# Patient Record
Sex: Female | Born: 1975 | Race: White | Hispanic: No | Marital: Single | Smoking: Current some day smoker
Health system: Southern US, Community
[De-identification: ages and names within clinical notes are randomized; demographics above are authoritative.]

## PROBLEM LIST (undated history)

## (undated) DIAGNOSIS — F419 Anxiety disorder, unspecified: Secondary | ICD-10-CM

## (undated) DIAGNOSIS — G43909 Migraine, unspecified, not intractable, without status migrainosus: Secondary | ICD-10-CM

## (undated) DIAGNOSIS — N83209 Unspecified ovarian cyst, unspecified side: Secondary | ICD-10-CM

## (undated) DIAGNOSIS — O139 Gestational [pregnancy-induced] hypertension without significant proteinuria, unspecified trimester: Secondary | ICD-10-CM

## (undated) HISTORY — PX: LEFT OOPHORECTOMY: SHX1961

## (undated) HISTORY — PX: LAPAROSCOPY: SHX197

## (undated) HISTORY — PX: TUBAL LIGATION: SHX77

## (undated) HISTORY — PX: ABDOMINAL HYSTERECTOMY: SHX81

---

## 1998-08-24 ENCOUNTER — Inpatient Hospital Stay (HOSPITAL_COMMUNITY): Admission: AD | Admit: 1998-08-24 | Discharge: 1998-08-24 | Payer: Self-pay | Admitting: Obstetrics & Gynecology

## 1998-09-23 ENCOUNTER — Inpatient Hospital Stay (HOSPITAL_COMMUNITY): Admission: AD | Admit: 1998-09-23 | Discharge: 1998-09-23 | Payer: Self-pay | Admitting: Obstetrics and Gynecology

## 1998-10-09 ENCOUNTER — Inpatient Hospital Stay (HOSPITAL_COMMUNITY): Admission: AD | Admit: 1998-10-09 | Discharge: 1998-10-09 | Payer: Self-pay | Admitting: Obstetrics and Gynecology

## 1998-10-29 ENCOUNTER — Inpatient Hospital Stay (HOSPITAL_COMMUNITY): Admission: AD | Admit: 1998-10-29 | Discharge: 1998-10-31 | Payer: Self-pay | Admitting: *Deleted

## 1999-01-30 ENCOUNTER — Ambulatory Visit (HOSPITAL_COMMUNITY): Admission: RE | Admit: 1999-01-30 | Discharge: 1999-01-30 | Payer: Self-pay | Admitting: *Deleted

## 2000-11-15 ENCOUNTER — Emergency Department (HOSPITAL_COMMUNITY): Admission: EM | Admit: 2000-11-15 | Discharge: 2000-11-16 | Payer: Self-pay | Admitting: Emergency Medicine

## 2001-07-23 ENCOUNTER — Ambulatory Visit (HOSPITAL_COMMUNITY): Admission: RE | Admit: 2001-07-23 | Discharge: 2001-07-23 | Payer: Self-pay | Admitting: *Deleted

## 2001-08-13 ENCOUNTER — Ambulatory Visit (HOSPITAL_COMMUNITY): Admission: RE | Admit: 2001-08-13 | Discharge: 2001-08-13 | Payer: Self-pay | Admitting: *Deleted

## 2001-08-13 ENCOUNTER — Encounter (INDEPENDENT_AMBULATORY_CARE_PROVIDER_SITE_OTHER): Payer: Self-pay

## 2002-03-07 ENCOUNTER — Other Ambulatory Visit: Admission: RE | Admit: 2002-03-07 | Discharge: 2002-03-07 | Payer: Self-pay | Admitting: Obstetrics and Gynecology

## 2003-04-03 ENCOUNTER — Other Ambulatory Visit: Admission: RE | Admit: 2003-04-03 | Discharge: 2003-04-03 | Payer: Self-pay | Admitting: *Deleted

## 2003-04-18 ENCOUNTER — Ambulatory Visit (HOSPITAL_COMMUNITY): Admission: RE | Admit: 2003-04-18 | Discharge: 2003-04-18 | Payer: Self-pay | Admitting: *Deleted

## 2004-04-29 ENCOUNTER — Other Ambulatory Visit: Admission: RE | Admit: 2004-04-29 | Discharge: 2004-04-29 | Payer: Self-pay | Admitting: Obstetrics and Gynecology

## 2004-09-09 ENCOUNTER — Emergency Department: Payer: Self-pay | Admitting: Emergency Medicine

## 2004-09-09 ENCOUNTER — Emergency Department (HOSPITAL_COMMUNITY): Admission: EM | Admit: 2004-09-09 | Discharge: 2004-09-09 | Payer: Self-pay | Admitting: Emergency Medicine

## 2004-09-10 ENCOUNTER — Ambulatory Visit: Payer: Self-pay | Admitting: Emergency Medicine

## 2004-12-20 ENCOUNTER — Emergency Department: Payer: Self-pay | Admitting: Emergency Medicine

## 2005-01-23 ENCOUNTER — Emergency Department: Payer: Self-pay | Admitting: Internal Medicine

## 2005-06-10 ENCOUNTER — Emergency Department: Payer: Self-pay | Admitting: Emergency Medicine

## 2005-07-08 ENCOUNTER — Other Ambulatory Visit: Admission: RE | Admit: 2005-07-08 | Discharge: 2005-07-08 | Payer: Self-pay | Admitting: Obstetrics and Gynecology

## 2005-08-20 ENCOUNTER — Emergency Department: Payer: Self-pay | Admitting: Emergency Medicine

## 2005-09-13 ENCOUNTER — Inpatient Hospital Stay (HOSPITAL_COMMUNITY): Admission: AD | Admit: 2005-09-13 | Discharge: 2005-09-13 | Payer: Self-pay | Admitting: Obstetrics and Gynecology

## 2005-09-21 ENCOUNTER — Inpatient Hospital Stay (HOSPITAL_COMMUNITY): Admission: AD | Admit: 2005-09-21 | Discharge: 2005-09-21 | Payer: Self-pay | Admitting: Obstetrics and Gynecology

## 2005-11-24 ENCOUNTER — Emergency Department: Payer: Self-pay | Admitting: Emergency Medicine

## 2006-04-12 ENCOUNTER — Inpatient Hospital Stay (HOSPITAL_COMMUNITY): Admission: AD | Admit: 2006-04-12 | Discharge: 2006-04-12 | Payer: Self-pay | Admitting: Obstetrics and Gynecology

## 2006-04-12 ENCOUNTER — Emergency Department: Payer: Self-pay | Admitting: Emergency Medicine

## 2006-05-12 ENCOUNTER — Emergency Department: Payer: Self-pay | Admitting: Internal Medicine

## 2006-10-17 ENCOUNTER — Emergency Department: Payer: Self-pay | Admitting: Emergency Medicine

## 2007-07-17 ENCOUNTER — Inpatient Hospital Stay (HOSPITAL_COMMUNITY): Admission: AD | Admit: 2007-07-17 | Discharge: 2007-07-18 | Payer: Self-pay | Admitting: Obstetrics and Gynecology

## 2007-12-26 ENCOUNTER — Inpatient Hospital Stay (HOSPITAL_COMMUNITY): Admission: AD | Admit: 2007-12-26 | Discharge: 2007-12-27 | Payer: Self-pay | Admitting: Obstetrics and Gynecology

## 2007-12-26 ENCOUNTER — Emergency Department: Payer: Self-pay | Admitting: Unknown Physician Specialty

## 2008-01-07 ENCOUNTER — Emergency Department: Payer: Self-pay | Admitting: Emergency Medicine

## 2008-04-20 ENCOUNTER — Emergency Department: Payer: Self-pay | Admitting: Emergency Medicine

## 2008-08-23 ENCOUNTER — Emergency Department (HOSPITAL_COMMUNITY): Admission: EM | Admit: 2008-08-23 | Discharge: 2008-08-23 | Payer: Self-pay | Admitting: Emergency Medicine

## 2008-08-31 ENCOUNTER — Emergency Department: Payer: Self-pay | Admitting: Unknown Physician Specialty

## 2008-10-06 ENCOUNTER — Emergency Department: Payer: Self-pay | Admitting: Internal Medicine

## 2008-11-15 ENCOUNTER — Emergency Department: Payer: Self-pay | Admitting: Emergency Medicine

## 2008-11-16 ENCOUNTER — Emergency Department: Payer: Self-pay | Admitting: Internal Medicine

## 2009-08-31 ENCOUNTER — Inpatient Hospital Stay (HOSPITAL_COMMUNITY): Admission: AD | Admit: 2009-08-31 | Discharge: 2009-08-31 | Payer: Self-pay | Admitting: Obstetrics and Gynecology

## 2009-09-01 ENCOUNTER — Inpatient Hospital Stay (HOSPITAL_COMMUNITY): Admission: AD | Admit: 2009-09-01 | Discharge: 2009-09-02 | Payer: Self-pay | Admitting: Obstetrics and Gynecology

## 2009-11-14 ENCOUNTER — Emergency Department: Payer: Self-pay | Admitting: Unknown Physician Specialty

## 2010-04-19 ENCOUNTER — Emergency Department: Payer: Self-pay | Admitting: Emergency Medicine

## 2010-07-20 ENCOUNTER — Emergency Department: Payer: Self-pay | Admitting: Emergency Medicine

## 2010-09-02 ENCOUNTER — Emergency Department: Payer: Self-pay | Admitting: Emergency Medicine

## 2011-02-20 LAB — URINALYSIS, ROUTINE W REFLEX MICROSCOPIC
Glucose, UA: NEGATIVE mg/dL
Ketones, ur: NEGATIVE mg/dL
Leukocytes, UA: NEGATIVE
Nitrite: NEGATIVE
Specific Gravity, Urine: 1.01 (ref 1.005–1.030)
pH: 6 (ref 5.0–8.0)

## 2011-02-20 LAB — CBC
MCV: 94.5 fL (ref 78.0–100.0)
RBC: 3.95 MIL/uL (ref 3.87–5.11)
WBC: 7.4 10*3/uL (ref 4.0–10.5)

## 2011-02-20 LAB — URINE MICROSCOPIC-ADD ON

## 2011-04-04 NOTE — Op Note (Signed)
NAMEGIULIA, [name]                         ACCOUNT NO.:  [id number]   MEDICAL RECORD NO.:  [id number]                   PATIENT TYPE:  AMB   LOCATION:  SDC                                  FACILITY:  [facility]   PHYSICIAN:  Tracie Harrier, M.D.              DATE OF BIRTH:  1976-01-26   DATE OF PROCEDURE:  04/18/2003  DATE OF DISCHARGE:                                 OPERATIVE REPORT   PREOPERATIVE DIAGNOSES:  1. History of pelvic pain.  2. Endometriosis.   POSTOPERATIVE DIAGNOSES:  1. History of pelvic pain.  2. Endometriosis, mild endometriosis noted.   OPERATION/PROCEDURE:  1. Laparoscopy.  2. Ablation of endometriosis.   ANESTHESIA:  General.   ESTIMATED BLOOD LOSS:  Less than 20 cc.   COMPLICATIONS:  None.   FINDINGS:  Multiple small areas of endometriosis were encountered during  laparoscopy.  The pelvis was consistent with her previous bilateral tubal  ligation.  The uterus was otherwise normal.  The left ovary did have two  areas of endometriosis about 1 cm in size which were ablated.  The right  ovary also with two areas of endometriosis that were ablated.  The posterior  cul-de-sac had two small areas of endometriosis on the left pelvic side wall  and one area of endometriosis on the right side wall.  There was an area of  endometriosis at the uterine cornua on the right.  All of these small areas  were ablated.  There were multiple small cysts on the ovaries bilaterally  and again, these were ablated as well.   DESCRIPTION OF PROCEDURE:  The patient was taken to the operating room where  a general endotracheal anesthetic was administered.  The patient was placed  on the operating room table in the dorsal lithotomy position, the abdomen,  peroneum and vagina were prepped and draped in the usual sterile fashion  with Betadine and sterile drapes.  A Hulka tenaculum was placed in the  intrauterine cavity for uterine manipulation.  The bladder was emptied with  a  red rubber catheter.  A 10 cc of 0.25% were then infiltrated into the area  just below the umbilicus and the right and left lower quadrants on the  patient's old Pfannenstiel incision.  Through the umbilical site, a Veress  needle was inserted under standard fashion for a closed laparoscopy.  This  was done atraumatically and a pneumoperitoneum was created with about 2.5 L  of carbon dioxide gas.  This created a pressure of 15 mmHg in the abdomen.  The Veress needle was removed and a disposable laparoscopic trocar was  inserted atraumatically into the abdominal cavity.  Two accessory incisions  were then made in the right and left lower quadrants, two fingerbreadths  above the pubic symphysis and in the patient's old Pfannenstiel incision.  Through this, two 5 mm trocars were inserted under direct, continuous  laparoscopic guidance.  The  laparoscope was then inserted and a  pneumoperitoneum was maintained with carbon dioxide gas.  The multiple areas  of endometriosis were then visualized, photographed and ablated with the  bipolar Kleppinger cautery.  Good hemostasis was noted.  All areas of  endometriosis were ablated that were encountered.  Good hemostasis was  noted.  The small cyst of the ovary were also ablated using the bipolar  Kleppinger cautery.  The pelvis was thoroughly visualized with no bleeding  noted.  Attention was then turned to closure.   All the abdominal instruments were removed and the gas was allowed to  completely escape from the abdomen.  The incision under the umbilicus was  then closed with three interrupted sutures of 0 Vicryl on the muscle fascia.  The three abdominal incisions were then closed with Dermabond glue.  All  vaginal instruments were removed.  The patient was awakened, extubated and  taken to the recovery room in good condition.  There were no preoperative  complications.                                                 Tracie Harrier,  M.D.    REG/MEDQ  D:  04/18/2003  T:  04/18/2003  Job:  [id number]

## 2011-04-04 NOTE — Op Note (Signed)
[facility]  Patient:    [name], [name] Visit Number: [id number] MRN: [id number]          Service Type: DSU Location: [facility] Attending Physician:  Donne Hazel Dictated by:   Willey Blade, M.D. Proc. Date: 08/13/01 Admit Date:  08/13/2001                             Operative Report  PREOPERATIVE DIAGNOSIS:       Pelvic pain.  POSTOPERATIVE DIAGNOSIS:      Pelvic pain with mild endometriosis.  PROCEDURES:                   1. Laparoscopy.                               2. Removal of Fishee clips.                               3. Ablation of endometriosis by cautery.  SURGEON:                      Willey Blade, M.D.  ANESTHESIA:                   General.  ESTIMATED BLOOD LOSS:         Less than 20 cc.  COMPLICATIONS:                None.  FINDINGS:                     At the time of surgery, the patients previous bilateral tubal ligation was noted.  The Filshee clips were in good location and were removed from the patient.  There were multiple small areas of endometriosis on the right and left ovary. These areas were biopsies for pathologic confirmation.  These were small and fleshy in appearance.  They were very small.  The uterus was otherwise normal.  The appendix was also visualized and noted to be normal.  DESCRIPTION OF PROCEDURE:     The patient was taken to the operating room, where general endotracheal anesthetic was administered.  The patient was placed on the operating table in the dorsal lithotomy position.  The abdomen, perineum and vagina were prepped and draped in the usual sterile fashion with Betadine and sterile drapes.  A Foley catheter was entered and the bladder was emptied.  Next, a Hulka tenaculum was placed in the intrauterine cavity for uterine manipulation.  A small transverse infraumbilical skin incision was made, through which a Veress needle was inserted atraumatically into the abdominal cavity.   Approximately 3.5 L of carbon dioxide gas was then used to create a pneumoperitoneum.  This created approximately 15 mmHg pressure.  The Veress needle was removed and a disposable laparoscopic trocar was inserted atraumatically into the abdominal cavity.  The laparoscope was then inserted with the above-noted findings.  An accessory incision was made two fingerbreadths above the pubic symphysis and in the midline.  Through this, a 5-mm trocar was placed under direct, continuous laparoscopic guidance.  Next, attention was turned to the pathologic findings.  The Filshee clips were removed by cauterizing the surrounding areas of the tube.  These removed very easily.  The left Filshee clip was gently nudged and  came off of the tube. There was a void area and an approximate 2 cm segment of tube missing from both of the tubes.  The Filshee clips were removed and the tubes were cauterized thoroughly around this area.  The adhesions around the right ovary were lysed with sharp dissection after cautery with the bipolar unit.  Two small areas that were representative of the noted findings were biopsied from the right ovary.  These appeared to be small areas of endometriosis.  All areas of endometriosis on the right and left ovary were cauterized.  I would say about five small areas of endometriosis on both ovaries were cauterized with the bipolar Klepinger device.  The pelvis was then thoroughly irrigated with copious amounts of irrigant and good hemostasis was noted.  All abdominal instruments were removed and the gas was allowed to completely escape from the abdomen.  The small infraumbilical skin incision site was closed first with three interrupted sutures of 0 Vicryl on the muscle fascia. The skin was reapproximated with three interrupted sutures of 3-0 Vicryl Rapide.  The small suprapubic site was closed with Dermabond glue.  All vaginal instruments were removed.  The patient was then  awakened, extubated and taken to the recovery room in good condition.  There were no perioperative complications. Dictated by:   Willey Blade, M.D. Attending Physician:  Donne Hazel DD:  08/13/01 TD:  08/13/01 Job: [id number]

## 2011-04-04 NOTE — H&P (Signed)
[name], [name]                         ACCOUNT NO.:  [id number]   MEDICAL RECORD NO.:  [id number]                   PATIENT TYPE:  AMB   LOCATION:  SDC                                  FACILITY:  [facility]   PHYSICIAN:  Tracie Harrier, M.D.              DATE OF BIRTH:  [birth year]   DATE OF ADMISSION:  04/18/2003  DATE OF DISCHARGE:                                HISTORY & PHYSICAL   HISTORY OF PRESENT ILLNESS:  Ms. [name] is a 35 year old female admitted for  laparoscopy due to recurrent pelvic pain.  She is status post bilateral  tubal ligation.  The patient has a long history of pelvic pain and known  endometriosis.  She has had increasing and worsening pelvic pain over the  past year and requests surgical intervention.  She is now admitted to  undergo laparoscopy for ablation of endometriosis.  She was counseled  regarding different treatments of this disorder including conservative  medication which she declined.   She does have a history of chronic hypertension, but is currently on no  medications.   MEDICAL HISTORY:  1. History of smoking.  2. History of pelvic pain.  3. History of endometriosis.   SURGICAL HISTORY:  1. History of cesarean section.  2. History of laparoscopic bilateral tubal ligation.  3. History of operative laparoscopy for endometriosis.   OB HISTORY:  1. Cesarean section x1.  2. Normal spontaneous vaginal delivery x1.  3. Bilateral tubal ligation.   CURRENT MEDICATIONS:  None.   ALLERGIES:  1. PENICILLIN.  2. SULFA.   PHYSICAL EXAMINATION:  VITAL SIGNS:  Stable. Temperature 97.3, pulse 97,  respirations 16, blood pressure 146/90.  GENERAL:  She is a well-developed, well-nourished female in no acute  distress.  HEENT:  Within normal limits.  NECK:  Supple without adenopathy or thyromegaly.  HEART:  Regular  rate and rhythm without murmurs, gallops or rub.  LUNGS:  Clear to auscultation.  BREASTS:  Deferred, but has been normal within  the last year in the office.  ABDOMEN:  Soft and benign without masses, tenderness, hernia or  organomegaly.  EXTREMITIES:  Neurologically grossly normal.  PELVIC:  Normal external female genitalia.  Vagina, cervix clear.  The  uterus is small, mildly tender and mobile.  The adnexa is mildly tender  without mass or nodularity.   ADMITTING DIAGNOSES:  1. History of pelvic pain, recurrent.  2. History of endometriosis, suspect recurrence.  3. Status post bilateral tubal ligation.   PLAN:  Laparoscopy with possible ablation of endometriosis.   DISCUSSION:  The risks and benefits of this surgery was discussed with the  patient.  The risk of bleeding, infection, risk of injury to surrounding  organs was reviewed. Also, alternative treatment options were reviewed with  her.  Tracie Harrier, M.D.    REG/MEDQ  D:  04/18/2003  T:  04/18/2003  Job:  [id number]

## 2011-06-29 ENCOUNTER — Inpatient Hospital Stay (HOSPITAL_COMMUNITY)
Admission: AD | Admit: 2011-06-29 | Discharge: 2011-06-29 | Disposition: A | Discharge status: Home / Self Care | Payer: Managed Care, Other (non HMO) | Source: Ambulatory Visit | Attending: Obstetrics and Gynecology | Admitting: Obstetrics and Gynecology

## 2011-06-29 ENCOUNTER — Encounter (HOSPITAL_COMMUNITY): Payer: Self-pay | Admitting: *Deleted

## 2011-06-29 ENCOUNTER — Inpatient Hospital Stay (HOSPITAL_COMMUNITY): Payer: Managed Care, Other (non HMO)

## 2011-06-29 DIAGNOSIS — N949 Unspecified condition associated with female genital organs and menstrual cycle: Secondary | ICD-10-CM | POA: Insufficient documentation

## 2011-06-29 DIAGNOSIS — N83209 Unspecified ovarian cyst, unspecified side: Secondary | ICD-10-CM | POA: Insufficient documentation

## 2011-06-29 LAB — COMPREHENSIVE METABOLIC PANEL
AST: 15 U/L (ref 0–37)
Albumin: 3.3 g/dL — ABNORMAL LOW (ref 3.5–5.2)
BUN: 8 mg/dL (ref 6–23)
Calcium: 8.8 mg/dL (ref 8.4–10.5)
Chloride: 99 mEq/L (ref 96–112)
Creatinine, Ser: 0.92 mg/dL (ref 0.50–1.10)
GFR calc non Af Amer: >60 mL/min (ref >60)
Total Bilirubin: 0.4 mg/dL (ref 0.3–1.2)

## 2011-06-29 LAB — CBC
HCT: 38.6 % (ref 36.0–46.0)
MCH: 32 pg (ref 26.0–34.0)
MCV: 91.5 fL (ref 78.0–100.0)
Platelets: 171 10*3/uL (ref 150–400)
RDW: 12.3 % (ref 11.5–15.5)

## 2011-06-29 LAB — URINALYSIS, ROUTINE W REFLEX MICROSCOPIC
Bilirubin Urine: NEGATIVE
Glucose, UA: NEGATIVE mg/dL
Hgb urine dipstick: NEGATIVE
Ketones, ur: NEGATIVE mg/dL
Protein, ur: NEGATIVE mg/dL
Urobilinogen, UA: 0.2 mg/dL (ref 0.0–1.0)

## 2011-06-29 MED ORDER — KETOROLAC TROMETHAMINE 60 MG/2ML IM SOLN
60.0000 mg | Freq: Once | INTRAMUSCULAR | Status: AC
  Administered 2011-06-29: 60 mg via INTRAMUSCULAR
  Filled 2011-06-29: qty 2

## 2011-06-29 NOTE — Progress Notes (Signed)
Pt presents to mau for c/o pain in middle of lower abdomen that radiates to back.  Has pressure with urinating.  Started about 2 hours ago all at once.

## 2011-06-29 NOTE — ED Provider Notes (Signed)
History     Chief Complaint  Patient presents with  . Pelvic Pain   HPI  Reports sudden onset of right sided pelvic pain that started two hours ago.  Pain is described as a sharp, constant pain, rated a  9/10.  Denies vaginal bleeding or abnormal vaginal discharge.  Increase pressure sensation with urination.    Past Medical History  Diagnosis Date  . Endometriosis     Past Surgical History  Procedure Date  . Laparoscopy   . Abdominal hysterectomy     2009  . Cesarean section     1999    No family history on file.  History  Substance Use Topics  . Smoking status: Current Everyday Smoker -- 0.5 packs/day  . Smokeless tobacco: Not on file  . Alcohol Use: Yes    Allergies:  Allergies  Allergen Reactions  . Antihistamines, Diphenhydramine-Type Other (See Comments)    Heart palpitations & racing  . Penicillins Rash  . Sulfa Antibiotics Rash    Prescriptions prior to admission  Medication Sig Dispense Refill  . citalopram (CELEXA) 10 MG tablet Take 10 mg by mouth daily.        Marland Kitchen ibuprofen (ADVIL,MOTRIN) 200 MG tablet Take 800 mg by mouth every 6 (six) hours as needed.          Review of Systems  Gastrointestinal: Positive for abdominal pain.  Genitourinary:       Bladder pressure  All other systems reviewed and are negative.   Physical Exam   Pulse 90, temperature 98.2 F (36.8 C), temperature source Oral, resp. rate 20, height 4' 10.5" (1.486 m), weight 50.259 kg (110 lb 12.8 oz), SpO2 98.00%.  Physical Exam  Constitutional: She is oriented to person, place, and time. She appears well-developed and well-nourished. She appears distressed ( uncomfortable appearing).  HENT:  Head: Normocephalic and atraumatic.  Neck: Normal range of motion. Neck supple.  Cardiovascular: Normal rate, regular rhythm and normal heart sounds.   Respiratory: Effort normal and breath sounds normal.  GI: Soft. She exhibits no mass. There is tenderness (right sided ). There is no  guarding.  Genitourinary:          Neurological: She is alert and oriented to person, place, and time. She has normal reflexes.  Skin: Skin is warm and dry.    MAU Course  Procedures  [place]   Assessment and Plan  Right Ovarian Cyst  Plan: DC to home F/U with Dr. Rana Snare  [facility] 06/29/2011, 8:17 PM

## 2011-06-29 NOTE — ED Provider Notes (Signed)
Reports improvement in pain after IM med and desires discharge home.

## 2011-06-29 NOTE — Progress Notes (Signed)
W. Muhammad, CNM at bedside.  Assessment done and poc discussed with pt.   

## 2011-06-29 NOTE — Progress Notes (Signed)
Has hysterectomy, has a right ovary.  Took 4 ibuprogen an hr ago.  Had same pain before and it was an ovarian cyst.

## 2011-08-08 LAB — URINALYSIS, ROUTINE W REFLEX MICROSCOPIC
Bilirubin Urine: NEGATIVE
Ketones, ur: NEGATIVE
Nitrite: NEGATIVE
Specific Gravity, Urine: 1.02
Urobilinogen, UA: 0.2
pH: 6

## 2011-08-08 LAB — DIFFERENTIAL
Basophils Absolute: 0
Basophils Relative: 0
Lymphocytes Relative: 31
Neutro Abs: 5.6
Neutrophils Relative %: 60

## 2011-08-08 LAB — CBC
Hemoglobin: 13.1
MCHC: 35.6
RDW: 11.9

## 2011-08-08 LAB — POCT PREGNANCY, URINE
Operator id: 28886
Preg Test, Ur: NEGATIVE

## 2011-10-10 ENCOUNTER — Inpatient Hospital Stay (HOSPITAL_COMMUNITY)
Admission: AD | Admit: 2011-10-10 | Discharge: 2011-10-10 | Discharge status: Against Medical Advice | Payer: Managed Care, Other (non HMO) | Source: Ambulatory Visit | Attending: Obstetrics and Gynecology | Admitting: Obstetrics and Gynecology

## 2011-10-10 DIAGNOSIS — R109 Unspecified abdominal pain: Secondary | ICD-10-CM | POA: Insufficient documentation

## 2011-10-10 NOTE — Progress Notes (Signed)
Pt states, " I've had pain in my right lower abdomen since I went to work this morning. I am also concerned that I have bleeding after intercoruse the last two times that I had sex since I've had a hysterectomy."

## 2012-03-20 ENCOUNTER — Encounter (HOSPITAL_COMMUNITY): Payer: Self-pay | Admitting: *Deleted

## 2012-03-20 ENCOUNTER — Inpatient Hospital Stay (HOSPITAL_COMMUNITY): Payer: Managed Care, Other (non HMO)

## 2012-03-20 ENCOUNTER — Inpatient Hospital Stay (HOSPITAL_COMMUNITY)
Admission: AD | Admit: 2012-03-20 | Discharge: 2012-03-20 | Disposition: A | Discharge status: Home / Self Care | Payer: Managed Care, Other (non HMO) | Source: Ambulatory Visit | Attending: Obstetrics and Gynecology | Admitting: Obstetrics and Gynecology

## 2012-03-20 DIAGNOSIS — N949 Unspecified condition associated with female genital organs and menstrual cycle: Secondary | ICD-10-CM | POA: Insufficient documentation

## 2012-03-20 DIAGNOSIS — R102 Pelvic and perineal pain: Secondary | ICD-10-CM

## 2012-03-20 DIAGNOSIS — R109 Unspecified abdominal pain: Secondary | ICD-10-CM | POA: Insufficient documentation

## 2012-03-20 HISTORY — DX: Migraine, unspecified, not intractable, without status migrainosus: G43.909

## 2012-03-20 LAB — CBC
MCH: 31 pg (ref 26.0–34.0)
MCHC: 33.9 g/dL (ref 30.0–36.0)
MCV: 91.3 fL (ref 78.0–100.0)
Platelets: 281 10*3/uL (ref 150–400)
RBC: 4.39 MIL/uL (ref 3.87–5.11)
RDW: 13 % (ref 11.5–15.5)

## 2012-03-20 LAB — URINALYSIS, ROUTINE W REFLEX MICROSCOPIC
Leukocytes, UA: NEGATIVE
Nitrite: NEGATIVE
Specific Gravity, Urine: <1.005 — ABNORMAL LOW (ref 1.005–1.030)
Urobilinogen, UA: 0.2 mg/dL (ref 0.0–1.0)
pH: 6 (ref 5.0–8.0)

## 2012-03-20 LAB — URINE MICROSCOPIC-ADD ON

## 2012-03-20 MED ORDER — OXYCODONE-ACETAMINOPHEN 5-325 MG PO TABS
1.0000 | ORAL_TABLET | Freq: Once | ORAL | Status: AC
  Administered 2012-03-20: 1 via ORAL
  Filled 2012-03-20: qty 1

## 2012-03-20 MED ORDER — OXYCODONE-ACETAMINOPHEN 5-325 MG PO TABS: 20.0000 | ORAL_TABLET | ORAL | Status: AC | PRN

## 2012-03-20 MED ORDER — ONDANSETRON 8 MG PO TBDP
8.0000 mg | ORAL_TABLET | Freq: Once | ORAL | Status: DC
  Filled 2012-03-20: qty 1

## 2012-03-20 NOTE — MAU Note (Signed)
Patient presents with lower abdominal pain has had hysterectomy thinks has right ovary history of endometriosis onset about 2 hours ago

## 2012-03-20 NOTE — Progress Notes (Signed)
Written and verbal d/c instructions given and understanding voiced. Aware to return if has fever, n/v/d and/or increased RLQ pain.

## 2012-03-20 NOTE — Discharge Instructions (Signed)
Your ultrasound tonight was normal. Take the medication as directed. Do not take it if you are driving or doing any activity that may cause injury as the medication will make you sleepy. Return here immediately if you develop fever, increased pain especially in the right lower quadrant, persistent vomiting or other problems.  Follow up with Dr. Rana Snare on Monday.

## 2012-03-20 NOTE — MAU Provider Note (Signed)
History     CSN: [id number]  Arrival date & time 03/20/12  1734   None     Chief Complaint  Patient presents with  . Abdominal Pain    HPI [name] is a 36 y.o. female who presents to MAU for abdominal pain. She had chronic pelvic pain and endometriosis that resulted in having a hysterectomy.she still has her right ovary. She has had ovarian cyst in the past. The pain started today approximately 2 hours prior to arrival to MAU. She rates the pain as 8/10. The pain is sharp. She took Excedrin without relief. The history was provided by the patient.  Past Medical History  Diagnosis Date  . Endometriosis   . Migraines     Past Surgical History  Procedure Date  . Laparoscopy   . Abdominal hysterectomy     2009  . Cesarean section     1999  . Tubal ligation     Family History  Problem Relation Age of Onset  . Anesthesia problems Neg Hx   . Hypotension Neg Hx   . Malignant hyperthermia Neg Hx   . Pseudochol deficiency Neg Hx     History  Substance Use Topics  . Smoking status: Current Everyday Smoker -- 0.5 packs/day  . Smokeless tobacco: Not on file  . Alcohol Use: Yes    OB History    Grav Para Term Preterm Abortions TAB SAB Ect Mult Living   2 2 1 1      2       Review of Systems  Constitutional: Negative for fever, chills and activity change.  HENT: Negative.   Eyes: Negative.   Respiratory: Negative.   Gastrointestinal: Positive for abdominal pain. Negative for nausea, vomiting, diarrhea and constipation.  Genitourinary: Negative for dysuria, urgency, frequency, vaginal bleeding and vaginal discharge.  Musculoskeletal: Negative for back pain.  Neurological: Negative for dizziness and headaches.  Psychiatric/Behavioral: Negative for confusion. The patient is not nervous/anxious.     Allergies  Antihistamines, diphenhydramine-type; Penicillins; and Sulfa antibiotics  Home Medications  No current outpatient prescriptions on file.  BP 114/79   Pulse 104  Temp(Src) 97 F (36.1 C) (Oral)  Resp 16  Ht 5' (1.524 m)  Wt 106 lb 12.8 oz (48.444 kg)  BMI 20.86 kg/m2  Physical Exam  Nursing note and vitals reviewed. Constitutional: She is oriented to person, place, and time. She appears well-developed and well-nourished.  HENT:  Head: Normocephalic.  Eyes: EOM are normal.  Neck: Neck supple.  Cardiovascular:       Tachycardia.  Pulmonary/Chest: Effort normal.  Abdominal: Soft. Normal appearance and bowel sounds are normal. There is tenderness in the right lower quadrant and suprapubic area. There is no rigidity, no rebound, no guarding and no CVA tenderness.  Musculoskeletal: Normal range of motion. She exhibits no tenderness.  Neurological: She is alert and oriented to person, place, and time. No cranial nerve deficit.  Skin: Skin is warm and dry.  Psychiatric: She has a normal mood and affect. Her behavior is normal. Judgment and thought content normal.   US Transvaginal Non-ob  03/20/2012  *RADIOLOGY REPORT*  Clinical Data: 36 year old female with right adnexal pain.  History of hysterectomy and left oophorectomy.  TRANSABDOMINAL AND TRANSVAGINAL ULTRASOUND OF PELVIS Technique:  Both transabdominal and transvaginal ultrasound examinations of the pelvis were performed. Transabdominal technique was performed for global imaging of the pelvis including uterus, ovaries, adnexal regions, and pelvic cul-de-sac.  Comparison: 06/29/2011 and earlier.   It was  necessary to proceed with endovaginal exam following the transabdominal exam to visualize the right adnexa.  Findings:  Uterus: Surgically absent.  Endometrium: Surgically absent.  Right ovary:  The right ovary today measures 2.0 x 2.6 x 1.5 cm (previously 1.9 x 4.1 x 2.8 cm). Several small oval hypodense areas probably are follicles, the largest 12 mm.  These are less numerous than on the prior.  Left ovary: Surgically absent.  Other findings: No free fluid  IMPRESSION: 1.  Right ovary is  within normal limits, probably containing small follicles. 2.  Uterus and left ovary are surgically absent.  Original Report Authenticated By: Harley Hallmark, M.D.   US Pelvis Complete  03/20/2012  *RADIOLOGY REPORT*  Clinical Data: 36 year old female with right adnexal pain.  History of hysterectomy and left oophorectomy.  TRANSABDOMINAL AND TRANSVAGINAL ULTRASOUND OF PELVIS Technique:  Both transabdominal and transvaginal ultrasound examinations of the pelvis were performed. Transabdominal technique was performed for global imaging of the pelvis including uterus, ovaries, adnexal regions, and pelvic cul-de-sac.  Comparison: 06/29/2011 and earlier.   It was necessary to proceed with endovaginal exam following the transabdominal exam to visualize the right adnexa.  Findings:  Uterus: Surgically absent.  Endometrium: Surgically absent.  Right ovary:  The right ovary today measures 2.0 x 2.6 x 1.5 cm (previously 1.9 x 4.1 x 2.8 cm). Several small oval hypodense areas probably are follicles, the largest 12 mm.  These are less numerous than on the prior.  Left ovary: Surgically absent.  Other findings: No free fluid  IMPRESSION: 1.  Right ovary is within normal limits, probably containing small follicles. 2.  Uterus and left ovary are surgically absent.  Original Report Authenticated By: Harley Hallmark, M.D.   Results for orders placed during the hospital encounter of 03/20/12 (from the past 24 hour(s))  URINALYSIS, ROUTINE W REFLEX MICROSCOPIC     Status: Abnormal   Collection Time   03/20/12  5:45 PM      Component Value Range   Color, Urine YELLOW  YELLOW    APPearance CLEAR  CLEAR    Specific Gravity, Urine <1.005 (*) 1.005 - 1.030    pH 6.0  5.0 - 8.0    Glucose, UA NEGATIVE  NEGATIVE (mg/dL)   Hgb urine dipstick TRACE (*) NEGATIVE    Bilirubin Urine NEGATIVE  NEGATIVE    Ketones, ur NEGATIVE  NEGATIVE (mg/dL)   Protein, ur NEGATIVE  NEGATIVE (mg/dL)   Urobilinogen, UA 0.2  0.0 - 1.0 (mg/dL)    Nitrite NEGATIVE  NEGATIVE    Leukocytes, UA NEGATIVE  NEGATIVE   URINE MICROSCOPIC-ADD ON     Status: Abnormal   Collection Time   03/20/12  5:45 PM      Component Value Range   Squamous Epithelial / LPF FEW (*) RARE    WBC, UA 0-2  <3 (WBC/hpf)   RBC / HPF 0-2  <3 (RBC/hpf)   Bacteria, UA FEW (*) RARE   CBC     Status: Abnormal   Collection Time   03/20/12  7:10 PM      Component Value Range   WBC 12.1 (*) 4.0 - 10.5 (K/uL)   RBC 4.39  3.87 - 5.11 (MIL/uL)   Hemoglobin 13.6  12.0 - 15.0 (g/dL)   HCT 40.9  81.1 - 91.4 (%)   MCV 91.3  78.0 - 100.0 (fL)   MCH 31.0  26.0 - 34.0 (pg)   MCHC 33.9  30.0 - 36.0 (g/dL)   RDW  13.0  11.5 - 15.5 (%)   Platelets 281  150 - 400 (K/uL)   Reevaluation @ 19:30 pm. Patient feeling much better after percocet. Abdomen is soft with minimal tenderness in the lower abdomen.   Assessment: Pelvic pain  Plan:  Rx Percocet   Follow up with Dr. Rana Snare   Return if fever, increased pain or other problems.   I have reviewed this patient's vital signs, nurses notes, appropriate labs and imaging. I have discussed this patient's exam, lab and ultrasound findings with Dr. Arelia Sneddon and he agrees with plan of care.   ED Course  Procedures   MDM

## 2012-11-04 ENCOUNTER — Encounter (HOSPITAL_COMMUNITY): Payer: Self-pay

## 2012-11-04 ENCOUNTER — Inpatient Hospital Stay (HOSPITAL_COMMUNITY)
Admission: AD | Admit: 2012-11-04 | Discharge: 2012-11-04 | Disposition: A | Discharge status: Home / Self Care | Payer: Managed Care, Other (non HMO) | Source: Ambulatory Visit | Attending: Obstetrics and Gynecology | Admitting: Obstetrics and Gynecology

## 2012-11-04 ENCOUNTER — Inpatient Hospital Stay (HOSPITAL_COMMUNITY): Payer: Managed Care, Other (non HMO)

## 2012-11-04 DIAGNOSIS — N83209 Unspecified ovarian cyst, unspecified side: Secondary | ICD-10-CM | POA: Insufficient documentation

## 2012-11-04 DIAGNOSIS — R1031 Right lower quadrant pain: Secondary | ICD-10-CM | POA: Insufficient documentation

## 2012-11-04 HISTORY — DX: Unspecified ovarian cyst, unspecified side: N83.209

## 2012-11-04 LAB — URINALYSIS, ROUTINE W REFLEX MICROSCOPIC
Bilirubin Urine: NEGATIVE
Glucose, UA: NEGATIVE mg/dL
Hgb urine dipstick: NEGATIVE
Ketones, ur: NEGATIVE mg/dL
Protein, ur: NEGATIVE mg/dL

## 2012-11-04 LAB — CBC
HCT: 41.9 % (ref 36.0–46.0)
MCHC: 34.1 g/dL (ref 30.0–36.0)
MCV: 95 fL (ref 78.0–100.0)
RDW: 12.1 % (ref 11.5–15.5)

## 2012-11-04 MED ORDER — KETOROLAC TROMETHAMINE 60 MG/2ML IM SOLN: 60.0000 mg | Freq: Once | INTRAMUSCULAR | Status: DC

## 2012-11-04 MED ORDER — KETOROLAC TROMETHAMINE 10 MG PO TABS: 10.0000 mg | ORAL_TABLET | Freq: Four times a day (QID) | ORAL | Status: DC | PRN

## 2012-11-04 MED ORDER — KETOROLAC TROMETHAMINE 60 MG/2ML IM SOLN
INTRAMUSCULAR | Status: AC
  Administered 2012-11-04: 60 mg
  Filled 2012-11-04: qty 2

## 2012-11-04 NOTE — MAU Note (Signed)
Real sharp pain on rt side- on ovary.  Been bothering her most of the day. Feels like when she has had cysts before.

## 2012-11-04 NOTE — MAU Note (Signed)
Patient is in with c/o pelvic pain and right lower quadrant pain that radiates to the back. Patient states that she has her right ovaries and the pain feels like ovarian cyst pain. She denies any vaginal bleeding or abnormal vaginal discharge.

## 2012-11-04 NOTE — MAU Provider Note (Signed)
History     CSN: [id number]  Arrival date and time: 11/04/12 1839   First Provider Initiated Contact with Patient 11/04/12 2020      Chief Complaint  Patient presents with  . Abdominal Pain   HPI [name] 36 y.o. Comes to MAU with RLQ pain which radiates to her back.  Has tried Tylenol and Advil earlier today with no relief.  Thinks it is ovarian pain and a possible ovarian cyst.  Has had cysts before.  Hx of hysterectomy.  Also has had endometriosis previously.  Was not able to get off from work to be seen in the office today, so came to MAU.  C/O nausea but no vomiting.  OB History    Grav Para Term Preterm Abortions TAB SAB Ect Mult Living   2 2 1 1      2       Past Medical History  Diagnosis Date  . Endometriosis   . Migraines   . Ovarian cyst     Past Surgical History  Procedure Date  . Laparoscopy   . Abdominal hysterectomy     2009  . Cesarean section     1999  . Tubal ligation   . Left oophorectomy     Family History  Problem Relation Age of Onset  . Anesthesia problems Neg Hx   . Hypotension Neg Hx   . Malignant hyperthermia Neg Hx   . Pseudochol deficiency Neg Hx     History  Substance Use Topics  . Smoking status: Current Every Day Smoker -- 0.5 packs/day  . Smokeless tobacco: Not on file  . Alcohol Use: Yes    Allergies:  Allergies  Allergen Reactions  . Antihistamines, Diphenhydramine-Type Other (See Comments)    Heart palpitations & racing  . Penicillins Rash  . Sulfa Antibiotics Rash    Prescriptions prior to admission  Medication Sig Dispense Refill  . LORazepam (ATIVAN) 2 MG tablet Take 2 mg by mouth 2 (two) times daily as needed. anxiety        Review of Systems  Constitutional: Negative for fever.  Gastrointestinal: Positive for nausea and abdominal pain. Negative for vomiting, diarrhea and constipation.  Genitourinary: Negative for dysuria.  Musculoskeletal: Positive for back pain.   Physical Exam   Blood  pressure 116/82, pulse 97, temperature 98.6 F (37 C), temperature source Oral, resp. rate 18, height 4\' 10"  (1.473 m), weight 52.617 kg (116 lb).  Physical Exam  Nursing note and vitals reviewed. Constitutional: She is oriented to person, place, and time. She appears well-developed and well-nourished.  HENT:  Head: Normocephalic.  Eyes: EOM are normal.  Neck: Neck supple.  GI: Soft. There is tenderness. There is no rebound.  Musculoskeletal: Normal range of motion.  Neurological: She is alert and oriented to person, place, and time.  Skin: Skin is warm and dry.  Psychiatric: She has a normal mood and affect.    MAU Course  Procedures Clinical Data: Right lower quadrant pain. History of a  hysterectomy and left oophorectomy.  TRANSABDOMINAL AND TRANSVAGINAL ULTRASOUND OF PELVIS  Technique: Both transabdominal and transvaginal ultrasound  examinations of the pelvis were performed. Transabdominal technique  was performed for global imaging of the pelvis including uterus,  ovaries, adnexal regions, and pelvic cul-de-sac.  It was necessary to proceed with endovaginal exam following the  transabdominal exam to visualize the adnexa.  Comparison: 03/20/2012  Findings:  Uterus: Surgically absent  Endometrium: Not applicable  Right ovary: Measures 4.3 x 3.8  x 3.4 cm. Contains a 3.6 x 3.1 x  2.9 cm cystic area with internal lace-like echoes, retracted clot,  and lack of internal vascularity.  Left ovary: Surgically absent  Other findings: Trace free fluid  IMPRESSION:  Right ovarian area is most in keeping with a hemorrhagic cyst  measuring 3.6 cm. A 6-week follow-up can be obtained to document  resolution.  Trace free fluid.   Results for orders placed during the hospital encounter of 11/04/12 (from the past 24 hour(s))  URINALYSIS, ROUTINE W REFLEX MICROSCOPIC     Status: Normal   Collection Time   11/04/12  7:01 PM      Component Value Range   Color, Urine YELLOW  YELLOW    APPearance CLEAR  CLEAR   Specific Gravity, Urine 1.015  1.005 - 1.030   pH 5.5  5.0 - 8.0   Glucose, UA NEGATIVE  NEGATIVE mg/dL   Hgb urine dipstick NEGATIVE  NEGATIVE   Bilirubin Urine NEGATIVE  NEGATIVE   Ketones, ur NEGATIVE  NEGATIVE mg/dL   Protein, ur NEGATIVE  NEGATIVE mg/dL   Urobilinogen, UA 0.2  0.0 - 1.0 mg/dL   Nitrite NEGATIVE  NEGATIVE   Leukocytes, UA NEGATIVE  NEGATIVE  CBC     Status: Normal   Collection Time   11/04/12  8:05 PM      Component Value Range   WBC 7.1  4.0 - 10.5 K/uL   RBC 4.41  3.87 - 5.11 MIL/uL   Hemoglobin 14.3  12.0 - 15.0 g/dL   HCT 11.9  14.7 - 82.9 %   MCV 95.0  78.0 - 100.0 fL   MCH 32.4  26.0 - 34.0 pg   MCHC 34.1  30.0 - 36.0 g/dL   RDW 56.2  13.0 - 86.5 %   Platelets 251  150 - 400 K/uL    MDM 2035  Discussed plan of care with Dr. Rana Snare - Toradol IM and ultrasound ordered.  Assessment and Plan  Ovarian cyst  Plan Toradol IM which gave pain relief Rx Toradol PO one po q 6 hours as needed for pain (#20) no refills - do not take with ibuprofen Follow up in the office for further evaluation.  Liem Copenhaver 11/04/2012, 8:25 PM

## 2013-03-13 ENCOUNTER — Inpatient Hospital Stay (HOSPITAL_COMMUNITY): Payer: 59

## 2013-03-13 ENCOUNTER — Inpatient Hospital Stay (HOSPITAL_COMMUNITY)
Admission: AD | Admit: 2013-03-13 | Discharge: 2013-03-13 | Disposition: A | Discharge status: Home / Self Care | Payer: 59 | Source: Ambulatory Visit | Attending: Obstetrics and Gynecology | Admitting: Obstetrics and Gynecology

## 2013-03-13 ENCOUNTER — Encounter (HOSPITAL_COMMUNITY): Payer: Self-pay

## 2013-03-13 DIAGNOSIS — N83209 Unspecified ovarian cyst, unspecified side: Secondary | ICD-10-CM | POA: Insufficient documentation

## 2013-03-13 DIAGNOSIS — R109 Unspecified abdominal pain: Secondary | ICD-10-CM | POA: Insufficient documentation

## 2013-03-13 DIAGNOSIS — N949 Unspecified condition associated with female genital organs and menstrual cycle: Secondary | ICD-10-CM | POA: Insufficient documentation

## 2013-03-13 DIAGNOSIS — M549 Dorsalgia, unspecified: Secondary | ICD-10-CM | POA: Insufficient documentation

## 2013-03-13 HISTORY — DX: Gestational (pregnancy-induced) hypertension without significant proteinuria, unspecified trimester: O13.9

## 2013-03-13 HISTORY — DX: Anxiety disorder, unspecified: F41.9

## 2013-03-13 LAB — URINALYSIS, ROUTINE W REFLEX MICROSCOPIC
Bilirubin Urine: NEGATIVE
Protein, ur: NEGATIVE mg/dL
Urobilinogen, UA: 0.2 mg/dL (ref 0.0–1.0)

## 2013-03-13 LAB — URINE MICROSCOPIC-ADD ON

## 2013-03-13 MED ORDER — HYDROCODONE-ACETAMINOPHEN 5-325 MG PO TABS: 1.0000 | ORAL_TABLET | Freq: Four times a day (QID) | ORAL | Status: DC | PRN

## 2013-03-13 MED ORDER — HYDROCODONE-ACETAMINOPHEN 5-325 MG PO TABS
1.0000 | ORAL_TABLET | Freq: Once | ORAL | Status: AC
  Administered 2013-03-13: 1 via ORAL
  Filled 2013-03-13: qty 1

## 2013-03-13 NOTE — MAU Note (Signed)
Lower abdominal pressure and back pain. States normally has pressure because of ovarian cyst, but is worse today. Back pain started 6 hours ago. States abdominal pressure makes it difficult to walk. Denies vaginal bleeding or discharge.

## 2013-03-13 NOTE — MAU Provider Note (Signed)
History     CSN: [id number]  Arrival date and time: 03/13/13 0235   First Provider Initiated Contact with Patient 03/13/13 0326      Chief Complaint  Patient presents with  . Pelvic Pain   Pelvic Pain Associated symptoms include abdominal pain (pelvic pain) and back pain.    Pt is here with report of lower abdominal pressure and back pain. States normally has pressure because of ovarian cyst, but is worse today. Back pain started 6 hours ago. States abdominal pressure makes it difficult to walk. Denies vaginal bleeding or discharge.     Past Medical History  Diagnosis Date  . Endometriosis   . Migraines   . Ovarian cyst   . Anxiety   . Pregnancy induced hypertension     Past Surgical History  Procedure Laterality Date  . Laparoscopy      x5  . Abdominal hysterectomy      2009  . Cesarean section      1999  . Tubal ligation    . Left oophorectomy      Family History  Problem Relation Age of Onset  . Anesthesia problems Neg Hx   . Hypotension Neg Hx   . Malignant hyperthermia Neg Hx   . Pseudochol deficiency Neg Hx     History  Substance Use Topics  . Smoking status: Current Every Day Smoker -- 0.50 packs/day  . Smokeless tobacco: Not on file  . Alcohol Use: Yes    Allergies:  Allergies  Allergen Reactions  . Antihistamines, Diphenhydramine-Type Other (See Comments)    Heart palpitations & racing  . Penicillins Rash  . Sulfa Antibiotics Rash    Prescriptions prior to admission  Medication Sig Dispense Refill  . HYDROcodone-acetaminophen (NORCO/VICODIN) 5-325 MG per tablet Take 1 tablet by mouth every 6 (six) hours as needed for pain.      Marland Kitchen LORazepam (ATIVAN) 1 MG tablet Take 1 mg by mouth every 8 (eight) hours as needed. For anxiety      . acetaminophen (TYLENOL) 500 MG tablet Take 1,000 mg by mouth every 6 (six) hours as needed. For headache/pain      . ketorolac (TORADOL) 10 MG tablet Take 1 tablet (10 mg total) by mouth every 6 (six) hours as  needed for pain (Do not take Ibuprofen with this medication.).  20 tablet  0    Review of Systems  Gastrointestinal: Positive for abdominal pain (pelvic pain).  Genitourinary: Negative.   Musculoskeletal: Positive for back pain.  All other systems reviewed and are negative.   Physical Exam   Blood pressure 138/88, pulse 101, temperature 97.7 F (36.5 C), temperature source Oral, resp. rate 18, height 4\' 11"  (1.499 m), weight 53.797 kg (118 lb 9.6 oz).  Physical Exam  Constitutional: She is oriented to person, place, and time. She appears well-developed and well-nourished.  HENT:  Head: Normocephalic.    Eyes: Pupils are equal, round, and reactive to light.  Neck: Normal range of motion. Neck supple.  Cardiovascular: Normal rate, regular rhythm and normal heart sounds.   Respiratory: Effort normal and breath sounds normal.  GI: Soft. There is tenderness (diffuse).  Genitourinary: Right adnexum displays tenderness.  Neurological: She is alert and oriented to person, place, and time. She has normal reflexes.  Skin: Skin is warm and dry.    MAU Course  Procedures  Consulted with Dr. Vincente Poli > reviewed HPI/exam > obtain ultrasound and give Vicodin as requested by patient for pain. (Pt declines Toradol) Ultrasound:  Normal right ovary; overlying gas and stool per verbal report.  Assessment and Plan  Pelvic Pain - Possible Ovulation or gas related pain  Plan: DC to home Follow-up with Dr Rana Snare Monday RX Vicodin #6 Consider using OTC simethicone  [facility] 03/13/2013, 3:26 AM

## 2013-09-05 ENCOUNTER — Emergency Department: Payer: Self-pay | Admitting: Emergency Medicine

## 2013-09-05 LAB — CBC
HCT: 40.6 % (ref 35.0–47.0)
MCH: 32.7 pg (ref 26.0–34.0)
MCHC: 35.4 g/dL (ref 32.0–36.0)
MCV: 93 fL (ref 80–100)
RBC: 4.39 10*6/uL (ref 3.80–5.20)
RDW: 12.5 % (ref 11.5–14.5)
WBC: 7 10*3/uL (ref 3.6–11.0)

## 2013-09-05 LAB — URINALYSIS, COMPLETE
Bilirubin,UR: NEGATIVE
Blood: NEGATIVE
Glucose,UR: NEGATIVE mg/dL (ref 0–75)
Ketone: NEGATIVE
Nitrite: NEGATIVE
Protein: NEGATIVE
RBC,UR: 6 /HPF (ref 0–5)
Squamous Epithelial: 11
WBC UR: 35 /HPF (ref 0–5)

## 2013-09-05 LAB — COMPREHENSIVE METABOLIC PANEL
Albumin: 3.9 g/dL (ref 3.4–5.0)
Anion Gap: 4 — ABNORMAL LOW (ref 7–16)
Chloride: 107 mmol/L (ref 98–107)
Co2: 28 mmol/L (ref 21–32)
EGFR (Non-African Amer.): >60
Osmolality: 276 (ref 275–301)
SGOT(AST): 12 U/L — ABNORMAL LOW (ref 15–37)
SGPT (ALT): 22 U/L (ref 12–78)
Sodium: 139 mmol/L (ref 136–145)
Total Protein: 7.1 g/dL (ref 6.4–8.2)

## 2013-09-05 LAB — LIPASE, BLOOD: Lipase: 101 U/L (ref 73–393)

## 2014-06-10 ENCOUNTER — Emergency Department (HOSPITAL_COMMUNITY)
Admission: EM | Admit: 2014-06-10 | Discharge: 2014-06-10 | Disposition: A | Discharge status: Other Acute Inpatient Hospital | Payer: 59 | Attending: Emergency Medicine | Admitting: Emergency Medicine

## 2014-06-10 ENCOUNTER — Inpatient Hospital Stay (HOSPITAL_COMMUNITY)
Admission: AD | Admit: 2014-06-10 | Discharge: 2014-06-15 | DRG: 880 | Disposition: A | Discharge status: Home / Self Care | Payer: 59 | Source: Intra-hospital | Attending: Psychiatry | Admitting: Psychiatry

## 2014-06-10 ENCOUNTER — Encounter (HOSPITAL_COMMUNITY): Payer: Self-pay | Admitting: *Deleted

## 2014-06-10 ENCOUNTER — Encounter (HOSPITAL_COMMUNITY): Payer: Self-pay | Admitting: Emergency Medicine

## 2014-06-10 DIAGNOSIS — F411 Generalized anxiety disorder: Secondary | ICD-10-CM | POA: Diagnosis present

## 2014-06-10 DIAGNOSIS — F3289 Other specified depressive episodes: Secondary | ICD-10-CM | POA: Diagnosis present

## 2014-06-10 DIAGNOSIS — F410 Panic disorder [episodic paroxysmal anxiety] without agoraphobia: Secondary | ICD-10-CM | POA: Diagnosis present

## 2014-06-10 DIAGNOSIS — F17200 Nicotine dependence, unspecified, uncomplicated: Secondary | ICD-10-CM | POA: Diagnosis present

## 2014-06-10 DIAGNOSIS — Z880 Allergy status to penicillin: Secondary | ICD-10-CM | POA: Insufficient documentation

## 2014-06-10 DIAGNOSIS — F319 Bipolar disorder, unspecified: Secondary | ICD-10-CM

## 2014-06-10 DIAGNOSIS — Z8742 Personal history of other diseases of the female genital tract: Secondary | ICD-10-CM | POA: Insufficient documentation

## 2014-06-10 DIAGNOSIS — F329 Major depressive disorder, single episode, unspecified: Secondary | ICD-10-CM | POA: Diagnosis present

## 2014-06-10 DIAGNOSIS — F29 Unspecified psychosis not due to a substance or known physiological condition: Secondary | ICD-10-CM | POA: Diagnosis present

## 2014-06-10 DIAGNOSIS — F23 Brief psychotic disorder: Secondary | ICD-10-CM | POA: Insufficient documentation

## 2014-06-10 DIAGNOSIS — Z8679 Personal history of other diseases of the circulatory system: Secondary | ICD-10-CM | POA: Insufficient documentation

## 2014-06-10 DIAGNOSIS — IMO0002 Reserved for concepts with insufficient information to code with codable children: Secondary | ICD-10-CM | POA: Diagnosis not present

## 2014-06-10 DIAGNOSIS — Z3202 Encounter for pregnancy test, result negative: Secondary | ICD-10-CM | POA: Insufficient documentation

## 2014-06-10 DIAGNOSIS — Z609 Problem related to social environment, unspecified: Secondary | ICD-10-CM | POA: Diagnosis not present

## 2014-06-10 DIAGNOSIS — G4700 Insomnia, unspecified: Secondary | ICD-10-CM | POA: Diagnosis present

## 2014-06-10 DIAGNOSIS — R443 Hallucinations, unspecified: Secondary | ICD-10-CM | POA: Insufficient documentation

## 2014-06-10 LAB — CBC WITH DIFFERENTIAL/PLATELET
BASOS ABS: 0 10*3/uL (ref 0.0–0.1)
BASOS PCT: 0 % (ref 0–1)
EOS PCT: 2 % (ref 0–5)
Eosinophils Absolute: 0.1 10*3/uL (ref 0.0–0.7)
HEMATOCRIT: 43 % (ref 36.0–46.0)
Hemoglobin: 15 g/dL (ref 12.0–15.0)
Lymphocytes Relative: 27 % (ref 12–46)
Lymphs Abs: 2 10*3/uL (ref 0.7–4.0)
MCH: 32.8 pg (ref 26.0–34.0)
MCHC: 34.9 g/dL (ref 30.0–36.0)
MCV: 94.1 fL (ref 78.0–100.0)
MONO ABS: 0.5 10*3/uL (ref 0.1–1.0)
Monocytes Relative: 7 % (ref 3–12)
NEUTROS ABS: 4.8 10*3/uL (ref 1.7–7.7)
Neutrophils Relative %: 64 % (ref 43–77)
PLATELETS: 261 10*3/uL (ref 150–400)
RBC: 4.57 MIL/uL (ref 3.87–5.11)
RDW: 12.5 % (ref 11.5–15.5)
WBC: 7.5 10*3/uL (ref 4.0–10.5)

## 2014-06-10 LAB — COMPREHENSIVE METABOLIC PANEL
ALBUMIN: 4 g/dL (ref 3.5–5.2)
ALT: 30 U/L (ref 0–35)
AST: 22 U/L (ref 0–37)
Alkaline Phosphatase: 62 U/L (ref 39–117)
Anion gap: 15 (ref 5–15)
BUN: 16 mg/dL (ref 6–23)
CALCIUM: 9.2 mg/dL (ref 8.4–10.5)
CHLORIDE: 101 meq/L (ref 96–112)
CO2: 23 mEq/L (ref 19–32)
CREATININE: 1.03 mg/dL (ref 0.50–1.10)
GFR calc Af Amer: 79 mL/min — ABNORMAL LOW (ref >90)
GFR calc non Af Amer: 69 mL/min — ABNORMAL LOW (ref >90)
Glucose, Bld: 104 mg/dL — ABNORMAL HIGH (ref 70–99)
Potassium: 4.4 mEq/L (ref 3.7–5.3)
SODIUM: 139 meq/L (ref 137–147)
Total Bilirubin: 0.3 mg/dL (ref 0.3–1.2)
Total Protein: 7.1 g/dL (ref 6.0–8.3)

## 2014-06-10 LAB — URINALYSIS, ROUTINE W REFLEX MICROSCOPIC
Bilirubin Urine: NEGATIVE
Glucose, UA: NEGATIVE mg/dL
Hgb urine dipstick: NEGATIVE
Ketones, ur: NEGATIVE mg/dL
LEUKOCYTES UA: NEGATIVE
Nitrite: NEGATIVE
PROTEIN: NEGATIVE mg/dL
SPECIFIC GRAVITY, URINE: 1.022 (ref 1.005–1.030)
UROBILINOGEN UA: 0.2 mg/dL (ref 0.0–1.0)
pH: 6.5 (ref 5.0–8.0)

## 2014-06-10 LAB — RAPID URINE DRUG SCREEN, HOSP PERFORMED
AMPHETAMINES: NOT DETECTED
Barbiturates: NOT DETECTED
Benzodiazepines: NOT DETECTED
COCAINE: NOT DETECTED
OPIATES: NOT DETECTED
TETRAHYDROCANNABINOL: NOT DETECTED

## 2014-06-10 LAB — PREGNANCY, URINE: Preg Test, Ur: NEGATIVE

## 2014-06-10 LAB — ETHANOL: Alcohol, Ethyl (B): <11 mg/dL (ref 0–11)

## 2014-06-10 MED ORDER — MAGNESIUM HYDROXIDE 400 MG/5ML PO SUSP: 30.0000 mL | Freq: Every day | ORAL | Status: DC | PRN

## 2014-06-10 MED ORDER — ACETAMINOPHEN 325 MG PO TABS: 650.0000 mg | ORAL_TABLET | ORAL | Status: DC | PRN

## 2014-06-10 MED ORDER — RISPERIDONE 2 MG PO TBDP
2.0000 mg | ORAL_TABLET | Freq: Every day | ORAL | Status: DC
  Administered 2014-06-10 – 2014-06-14 (×4): 2 mg via ORAL
  Filled 2014-06-10: qty 1
  Filled 2014-06-10: qty 3
  Filled 2014-06-10 (×3): qty 1
  Filled 2014-06-10: qty 3
  Filled 2014-06-10 (×5): qty 1

## 2014-06-10 MED ORDER — ACETAMINOPHEN 325 MG PO TABS
650.0000 mg | ORAL_TABLET | Freq: Four times a day (QID) | ORAL | Status: DC | PRN
  Administered 2014-06-13: 650 mg via ORAL
  Filled 2014-06-10: qty 2

## 2014-06-10 MED ORDER — ALUM & MAG HYDROXIDE-SIMETH 200-200-20 MG/5ML PO SUSP: 30.0000 mL | ORAL | Status: DC | PRN

## 2014-06-10 MED ORDER — IBUPROFEN 200 MG PO TABS: 600.0000 mg | ORAL_TABLET | Freq: Three times a day (TID) | ORAL | Status: DC | PRN

## 2014-06-10 MED ORDER — LORAZEPAM 1 MG PO TABS
1.0000 mg | ORAL_TABLET | Freq: Three times a day (TID) | ORAL | Status: DC | PRN
  Administered 2014-06-10: 1 mg via ORAL
  Filled 2014-06-10: qty 1

## 2014-06-10 NOTE — ED Notes (Signed)
Patient placed in paper scrubs. Security called to wand the patient. Patient made aware of the plan of care.

## 2014-06-10 NOTE — ED Notes (Addendum)
Patient threatening to leave. Patient adamantly states she wants to go home. Patient wanded. All Belongings given to the family. Rob, PA notified. Family brought from the room and spoke with PA.

## 2014-06-10 NOTE — BH Assessment (Signed)
Assessment complete. [name][facility], confirmed bed availability. Gave clinical report to Alberteen SamFran Hobson, NP who accepted Pt to the service of Dr. Cyndia DiverM. Akintayo, room [id number]. Alberteen SamFran Hobson, NP also recommended that if Pt will not sign in voluntarily that she be placed under IVC. Notified Roxy Horsemanobert Browning, PA-C of acceptance.  Harlin RainFord Ellis Ria CommentWarrick Jr, LPC, [facility] Triage Specialist [phone]

## 2014-06-10 NOTE — ED Notes (Signed)
[facility] give [facility].

## 2014-06-10 NOTE — BH Assessment (Signed)
Notified of assessment request in shift report. Spoke to [employer]t Browning, PA-C who said Pt may be psychotic. She believes she is pregnant but she is not, she has been undressing in public and has other odd behaviors. Tele-assessment will be initiated.  Harlin RainFord Ellis Ria CommentWarrick Jr, LPC, [facility] Triage Specialist [phone]

## 2014-06-10 NOTE — ED Notes (Signed)
TTS monitor placed in the room. Patient waiting to speak with Psychiatrist.

## 2014-06-10 NOTE — Progress Notes (Signed)
[facility] Per chart, pt and family report new onset of paranoia and bizarre behavior x 3 weeks. Pt's OP MD changed her from ativan to seroquel 5 weeks ago and family attributes her change in behavior to the seroquel. No previous hx of such symptoms. Per family, pt observed talking to self in nonsensical language, undressing at inappropriate times and believing she is pregnant even though pt underwent hysterectomy. Symptoms have become so severe patient recently lost her job at [employer]. Patient has teenage sons in the home both of whom have their own mental health issues. Pt with hx of migraines, only home med is seroquel. Pt oriented to unit, skin assessed and paper work completed. Pt very anxious, refusing to sit down during admit/VS. Pt forwards little information and asked several times, "can't we just do this in the morning? I just want to lie down." Medicated per order with risperdal M tab and brief med education provided. Per chart pt expressed some passive SI with no plan or intent while in ED however now denies. Also denies AVH at present. Pt given snack and is presently resting in bed safely. Lawrence MarseillesFriedman, Ghazi Rumpf Eakes

## 2014-06-10 NOTE — Tx Team (Signed)
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     Passive SI 06/10/14                                                DISCHARGE CRITERIA:  Improved stabilization in mood, thinking, and/or behavior Motivation to continue treatment in a less acute level of care Need for constant or close observation no longer present Reduction of life-threatening or endangering symptoms to within safe limits Verbal commitment to aftercare and medication compliance  PRELIMINARY DISCHARGE PLAN: Attend aftercare/continuing care group Outpatient therapy Return to previous living arrangement  PATIENT/FAMIILY INVOLVEMENT: This treatment plan has been presented to and reviewed with the patient, [name], and/or family member.  The patient and family have been given the opportunity to ask questions and make suggestions.  [name], [name] [facility] 06/10/2014, 11:41 PM

## 2014-06-10 NOTE — ED Notes (Addendum)
TTS stated they needed to speak with patient. Made aware the equipment is set up. Speaking with patient now.

## 2014-06-10 NOTE — ED Provider Notes (Signed)
CSN: [id number]     Arrival date & time 06/10/14  1553 History   First MD Initiated Contact with Patient 06/10/14 1629     Chief Complaint  Patient presents with  . Hallucinations     (Consider location/radiation/quality/duration/timing/severity/associated sxs/prior Treatment) HPI Comments: Patient presents to the emergency department with chief complaint of hallucinations, and anxiety. Patient does not recognize these symptoms within herself, but family members report the patient has not been acting like herself. She has been stating that she is pregnant, however she has had a hysterectomy. She is also undressed in public several times, as well as stolen magazines. She is accompanied by her mother and her boyfriend, who state that these behaviors are new. The onset of symptoms was 3 weeks ago, after the patient visited her grandmother's house for the first time in many years.  She denies any SI/HI.  The history is provided by the patient. No language interpreter was used.    Past Medical History  Diagnosis Date  . Endometriosis   . Migraines   . Ovarian cyst   . Anxiety   . Pregnancy induced hypertension    Past Surgical History  Procedure Laterality Date  . Laparoscopy      x5  . Abdominal hysterectomy      2009  . Cesarean section      1999  . Tubal ligation    . Left oophorectomy     Family History  Problem Relation Age of Onset  . Anesthesia problems Neg Hx   . Hypotension Neg Hx   . Malignant hyperthermia Neg Hx   . Pseudochol deficiency Neg Hx    History  Substance Use Topics  . Smoking status: Current Every Day Smoker -- 0.50 packs/day  . Smokeless tobacco: Not on file  . Alcohol Use: Yes   OB History   Grav Para Term Preterm Abortions TAB SAB Ect Mult Living   2 2 1 1      2      Review of Systems  All other systems reviewed and are negative.     Allergies  Antihistamines, diphenhydramine-type; Penicillins; and Sulfa antibiotics  Home Medications    Prior to Admission medications   Medication Sig Start Date End Date Taking? Authorizing Provider  LORazepam (ATIVAN) 1 MG tablet Take 1 mg by mouth every 8 (eight) hours as needed. For anxiety    Historical Provider, MD   BP 128/74  Pulse 87  Temp(Src) 98.3 F (36.8 C) (Oral)  Resp 18  SpO2 98% Physical Exam  Nursing note and vitals reviewed. Constitutional: She is oriented to person, place, and time. She appears well-developed and well-nourished.  HENT:  Head: Normocephalic and atraumatic.  Eyes: Conjunctivae and EOM are normal. Pupils are equal, round, and reactive to light.  Neck: Normal range of motion. Neck supple.  Cardiovascular: Normal rate and regular rhythm.  Exam reveals no gallop and no friction rub.   No murmur heard. Pulmonary/Chest: Effort normal and breath sounds normal. No respiratory distress. She has no wheezes. She has no rales. She exhibits no tenderness.  Abdominal: Soft. Bowel sounds are normal. She exhibits no distension and no mass. There is no tenderness. There is no rebound and no guarding.  Musculoskeletal: Normal range of motion. She exhibits no edema and no tenderness.  Neurological: She is alert and oriented to person, place, and time.  Skin: Skin is warm and dry.  Psychiatric: She has a normal mood and affect. Her behavior is normal. Judgment and  thought content normal.    ED Course  Procedures (including critical care time) Results for orders placed during the hospital encounter of 06/10/14  CBC WITH DIFFERENTIAL      Result Value Ref Range   WBC 7.5  4.0 - 10.5 K/uL   RBC 4.57  3.87 - 5.11 MIL/uL   Hemoglobin 15.0  12.0 - 15.0 g/dL   HCT 45.4  09.8 - 11.9 %   MCV 94.1  78.0 - 100.0 fL   MCH 32.8  26.0 - 34.0 pg   MCHC 34.9  30.0 - 36.0 g/dL   RDW 14.7  82.9 - 56.2 %   Platelets 261  150 - 400 K/uL   Neutrophils Relative % 64  43 - 77 %   Neutro Abs 4.8  1.7 - 7.7 K/uL   Lymphocytes Relative 27  12 - 46 %   Lymphs Abs 2.0  0.7 - 4.0  K/uL   Monocytes Relative 7  3 - 12 %   Monocytes Absolute 0.5  0.1 - 1.0 K/uL   Eosinophils Relative 2  0 - 5 %   Eosinophils Absolute 0.1  0.0 - 0.7 K/uL   Basophils Relative 0  0 - 1 %   Basophils Absolute 0.0  0.0 - 0.1 K/uL  COMPREHENSIVE METABOLIC PANEL      Result Value Ref Range   Sodium 139  137 - 147 mEq/L   Potassium 4.4  3.7 - 5.3 mEq/L   Chloride 101  96 - 112 mEq/L   CO2 23  19 - 32 mEq/L   Glucose, Bld 104 (*) 70 - 99 mg/dL   BUN 16  6 - 23 mg/dL   Creatinine, Ser 1.30  0.50 - 1.10 mg/dL   Calcium 9.2  8.4 - 86.5 mg/dL   Total Protein 7.1  6.0 - 8.3 g/dL   Albumin 4.0  3.5 - 5.2 g/dL   AST 22  0 - 37 U/L   ALT 30  0 - 35 U/L   Alkaline Phosphatase 62  39 - 117 U/L   Total Bilirubin 0.3  0.3 - 1.2 mg/dL   GFR calc non Af Amer 69 (*) >90 mL/min   GFR calc Af Amer 79 (*) >90 mL/min   Anion gap 15  5 - 15  URINE RAPID DRUG SCREEN (HOSP PERFORMED)      Result Value Ref Range   Opiates NONE DETECTED  NONE DETECTED   Cocaine NONE DETECTED  NONE DETECTED   Benzodiazepines NONE DETECTED  NONE DETECTED   Amphetamines NONE DETECTED  NONE DETECTED   Tetrahydrocannabinol NONE DETECTED  NONE DETECTED   Barbiturates NONE DETECTED  NONE DETECTED  ETHANOL      Result Value Ref Range   Alcohol, Ethyl (B) <11  0 - 11 mg/dL  PREGNANCY, URINE      Result Value Ref Range   Preg Test, Ur NEGATIVE  NEGATIVE  URINALYSIS, ROUTINE W REFLEX MICROSCOPIC      Result Value Ref Range   Color, Urine YELLOW  YELLOW   APPearance CLEAR  CLEAR   Specific Gravity, Urine 1.022  1.005 - 1.030   pH 6.5  5.0 - 8.0   Glucose, UA NEGATIVE  NEGATIVE mg/dL   Hgb urine dipstick NEGATIVE  NEGATIVE   Bilirubin Urine NEGATIVE  NEGATIVE   Ketones, ur NEGATIVE  NEGATIVE mg/dL   Protein, ur NEGATIVE  NEGATIVE mg/dL   Urobilinogen, UA 0.2  0.0 - 1.0 mg/dL   Nitrite NEGATIVE  NEGATIVE   Leukocytes, UA NEGATIVE  NEGATIVE      Imaging Review No results found.   EKG Interpretation None       MDM   Final diagnoses:  Brief psychotic disorder    Patient with possible psychotic break.  Believes she is pregnant despite hysterectomy.  Undressed in public multiple times.  Steals things.  All new for her in the past 3 weeks.  Voluntary.  TTS consult pending.  8:26 PM Patient meets inpatient criteria. [facility]  Accepting physician is Dr. Jannifer FranklinAkintayo.  Will transfer at this time.  Patient is currently voluntary, but she does meet IVC criteria per TTS.   Roxy Horsemanobert Dayquan Buys, PA-C 06/10/14 2027

## 2014-06-10 NOTE — ED Notes (Signed)
Patient ordered tray
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
## 2014-06-10 NOTE — ED Notes (Signed)
PA Rob at the bedside 

## 2014-06-10 NOTE — ED Notes (Signed)
TTS called. Stated she would be the first patient at [id number]. Patient made aware of the plan of care. Patient states she will wait.

## 2014-06-10 NOTE — ED Provider Notes (Signed)
Medical screening examination/treatment/procedure(s) were performed by non-physician practitioner and as supervising physician I was immediately available for consultation/collaboration.   EKG Interpretation None        Sallyann Kinnaird, MD 06/10/14 [id number] 

## 2014-06-10 NOTE — ED Notes (Signed)
Pt in with family- pt states that she was taken off her ativan a few weeks ago and was started on Seroquel, states since that time she isn't feeling well and needs to get back on her ativan, pt states she is pregnant and that [facility] and [facility] told her that she was.- Pt family reports that patient is not pregnant, that [facility]. States they have been through this before and that patient is hallucinating. States patient has had other irrational behaviors, reports seeing people or going places when these events did not occur. Pt denies SI/HI.

## 2014-06-11 DIAGNOSIS — F19239 Other psychoactive substance dependence with withdrawal, unspecified: Secondary | ICD-10-CM

## 2014-06-11 DIAGNOSIS — F29 Unspecified psychosis not due to a substance or known physiological condition: Secondary | ICD-10-CM

## 2014-06-11 DIAGNOSIS — F19939 Other psychoactive substance use, unspecified with withdrawal, unspecified: Secondary | ICD-10-CM

## 2014-06-11 MED ORDER — DIAZEPAM 5 MG PO TABS: 5.0000 mg | ORAL_TABLET | Freq: Every day | ORAL | Status: DC

## 2014-06-11 MED ORDER — RISPERIDONE 1 MG PO TBDP
1.0000 mg | ORAL_TABLET | Freq: Once | ORAL | Status: AC
  Administered 2014-06-11: 1 mg via ORAL
  Filled 2014-06-11: qty 1

## 2014-06-11 MED ORDER — DIAZEPAM 5 MG PO TABS
5.0000 mg | ORAL_TABLET | Freq: Once | ORAL | Status: AC
  Administered 2014-06-11: 5 mg via ORAL

## 2014-06-11 MED ORDER — DIAZEPAM 5 MG PO TABS
ORAL_TABLET | ORAL | Status: AC
  Administered 2014-06-11: 5 mg via ORAL
  Filled 2014-06-11: qty 1

## 2014-06-11 MED ORDER — ENSURE COMPLETE PO LIQD
237.0000 mL | Freq: Two times a day (BID) | ORAL | Status: DC
  Administered 2014-06-12 – 2014-06-15 (×5): 237 mL via ORAL

## 2014-06-11 MED ORDER — RISPERIDONE 1 MG PO TBDP
ORAL_TABLET | ORAL | Status: AC
  Filled 2014-06-11: qty 1

## 2014-06-11 MED ORDER — HALOPERIDOL LACTATE 5 MG/ML IJ SOLN
INTRAMUSCULAR | Status: AC
  Administered 2014-06-11: 5 mg
  Filled 2014-06-11: qty 1

## 2014-06-11 MED ORDER — LORAZEPAM 2 MG/ML IJ SOLN
INTRAMUSCULAR | Status: AC
  Administered 2014-06-11: 1 mg via INTRAMUSCULAR
  Filled 2014-06-11: qty 1

## 2014-06-11 MED ORDER — LORAZEPAM 2 MG/ML IJ SOLN: 1.0000 mg | Freq: Three times a day (TID) | INTRAMUSCULAR | Status: DC | PRN

## 2014-06-11 MED ORDER — DIAZEPAM 5 MG PO TABS: 5.0000 mg | ORAL_TABLET | Freq: Two times a day (BID) | ORAL | Status: DC

## 2014-06-11 MED ORDER — HALOPERIDOL LACTATE 5 MG/ML IJ SOLN: 5.0000 mg | Freq: Three times a day (TID) | INTRAMUSCULAR | Status: DC | PRN

## 2014-06-11 NOTE — Progress Notes (Signed)
Did not attended group 

## 2014-06-11 NOTE — Progress Notes (Addendum)
Patient ID: Kristina GuilesMarcia A Cooper, female   DOB: [birth year], 38 y.o.   MRN: [id number] D. Patient presents with anxious mood, affect congruent. This am patient reports '' I'm feeling better this morning, now that I've gotten some sleep and am coming off the seroquel. '' Patient reports that she started hearing auditory hallucinations and having poor sleep/nightmares after starting seroquel 4 weeks ago. Currently, she denies any AH/VH. However as the shift progressed, patient became increasingly agitated, paranoid, pacing and pushing on doors to [address] hall unit. She began demanding to leave and refusing any prn medications stating '' I see my family there. I see them standing and I want to go ! '' Her thoughts increasingly disorganized as she states '' My clothes? When can I leave. I'm ready to go now. '' She was unable to process staff redirection. A. Notified Dr. Lynnae SandhoffAkthar of above information and patients behaviors/hallucinations. Orders received.  Medications given as ordered. Support and encouragement provided.  . R. Patient is safe, however she continues to require frequent redirection from staff. Notified Heron SabinsEric RN, [facility] of above information as patient continues to attempt to elope from unti. Will continue to monitor q 15 minutes for safety, and monitor for effectiveness of prn antipsychotic medications.

## 2014-06-11 NOTE — [facility] Group Notes (Signed)
[facility] Group Notes:  (Nursing/MHT/Case Management/Adjunct)  Date:  06/11/2014  Time:  9:00AM  Type of Therapy:  Self Inventory  Participation Level:  Did Not Attend   Kellie Moorerry, Addie Cederberg M 06/11/2014, 12:27 PM
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
## 2014-06-11 NOTE — Progress Notes (Signed)
Patient ID: [name], female   DOB: 09/16/1976, 38 y.o.   MRN: [id number] D. Patient continues to be intrusive, disorganized and unable to process staff redirection at times. Noted patient several times attempting to elope from 400 hall staff doorway. She was able to be redirected, but remains confused. Pt unable to tolerate room mate at this time, due to paranoia and hallucinations. Orders received for No Room mate. A. Support and encouragement provided. R. Will continue to monitor closely.

## 2014-06-11 NOTE — [facility] Counselor (Signed)
Adult Comprehensive Assessment  Patient ID: Kristina GuilesMarcia A Cooper, female   DOB: 06/20/1976, 38 y.o.   MRN: [id number]  Information Source: Information source: Patient  Current Stressors:  Educational / Learning stressors: n/a Employment / Job issues: n/a Family Relationships: n/a Surveyor, quantityinancial / Lack of resources (include bankruptcy): n/a Housing / Lack of housing: n/a Physical health (include injuries & life threatening diseases): n/a Social relationships: n/a Substance abuse: n/a Bereavement / Loss: n/a  Living/Environment/Situation:  Living Arrangements: Children Living conditions (as described by patient or guardian): did not disclose How long has patient lived in current situation?: did not dislcose What is atmosphere in current home: Supportive  Family History:  Marital status: Single Does patient have children?: Yes How many children?: 2 How is patient's relationship with their children?: "they are good kids; I miss them"  Childhood History:  By whom was/is the patient raised?: Both parents;Mother Description of patient's relationship with caregiver when they were a child: "good" Patient's description of current relationship with people who raised him/her: "good" Does patient have siblings?: Yes Number of Siblings: 1 Description of patient's current relationship with siblings: "fine"; sister lives 10 minutes away Did patient suffer any verbal/emotional/physical/sexual abuse as a child?: No Did patient suffer from severe childhood neglect?: No Has patient ever been sexually abused/assaulted/raped as an adolescent or adult?: No Was the patient ever a victim of a crime or a disaster?: No Witnessed domestic violence?: No Has patient been effected by domestic violence as an adult?: No  Education:  Highest grade of school patient has completed: 12th grade Currently a student?: No Learning disability?: No  Employment/Work Situation:   Employment situation: Unemployed (Pt  reports she lost her job this week; was employed at [employer] for 5 years) Patient's job has been impacted by current illness: No (unable to state) What is the longest time patient has a held a job?: did not disclose Where was the patient employed at that time?: n/a Has patient ever been in the [employer] and Companymilitary?: No Has patient ever served in Buyer, retailcombat?: No  Financial Resources:   Surveyor, quantityinancial resources: [employer];No income (typically from employment) Does patient have a representative payee or guardian?: No  Alcohol/Substance Abuse:   What has been your use of drugs/alcohol within the last 12 months?: Pt denies If attempted suicide, did drugs/alcohol play a role in this?:  (n/a) Alcohol/Substance Abuse Treatment Hx: Denies past history Has alcohol/substance abuse ever caused legal problems?: No  Social Support System:   Patient's Community Support System: Good Describe Community Support System: supportive children, family, friends Type of faith/religion: Ephriam KnucklesChristian How does patient's faith help to cope with current illness?: "faith gets me through the day"  Leisure/Recreation:   Leisure and Hobbies[place]  Strengths/Needs:   What things does the patient do well?: following directions and completing tasks In what areas does patient struggle / problems for patient: "can't think of anything"  Discharge Plan:   Does patient have access to transportation?: Yes Will patient be returning to same living situation after discharge?: Yes Currently receiving community mental health services: Yes (From Whom) (Dr. Evelene CroonKaur; no therapist) If no, would patient like referral for services when discharged?: No Does patient have financial barriers related to discharge medications?: No  Summary/Recommendations:   Patient is a 38 year old Caucasian female who presented to the hospital with increased anxiety and psychotic symptoms. Pt reports she has been in outpatient treatment for anxiety for years with Dr.  Milagros Evenerupinder Kaur. Approximately five weeks ago Pt was taken  off Ativan and put on Seroquel. Pt and parents report approximately three weeks ago Pt began have increased anxiety, depression and psychotic symptoms. These symptoms include believing she is pregnant despite her physician telling her she is not, taking her clothes off at inappropriate times, believing people are looking into her house and trying to harm her, responding to people who are not there, staying in bed and not caring for her hygiene or grooming, and, according to parents, periods of confusion and "talking nonsense." Previously stated information was gathered from Pt's chart.  During PSA, Pt was a poor historian, had trouble sitting still, and became tearful as she stated "I miss my kids; I just want to go home."  Pt had difficulty verbalizing her reason for admission reporting that she was not feeling well and needed her medication fixed.  Pt's answers to questions were greatly delayed, as CSW had to repeat the questions because Pt seemed to be focused on other things.  Pt often gave short answers and did not elaborate, even when prompted.  Patient will benefit from crisis stabilization, medication evaluation, group therapy and psycho education in addition to case management for discharge planning.     Elaina Hoops. 06/11/2014

## 2014-06-11 NOTE — [facility] Group Notes (Signed)
[facility] LCSW Group Therapy  06/11/2014 3:56 PM  Type of Therapy:  Group Therapy  Participation Level:  Did Not Attend  Participation Quality:  N/A  Affect:  N/A  Cognitive:  N/A  Insight:  N/A  Engagement in Therapy:  N/A  Modes of Intervention:  Discussion, Education, Exploration, Rapport Building, Socialization and Support  Summary of Progress/Problems: Pt did not attend  Matthieu Loftus Anne 06/11/2014, 3:56 PM  

## 2014-06-11 NOTE — [facility] Group Notes (Signed)
[facility] Group Notes:  (Nursing/MHT/Case Management/Adjunct)  Date:  06/11/2014  Time:  9:10AM  Type of Therapy:  Psychoeducational Skills  Participation Level:  Did Not Attend   Kellie Moorerry, Karie Skowron M 06/11/2014, 12:28 PM

## 2014-06-12 DIAGNOSIS — F410 Panic disorder [episodic paroxysmal anxiety] without agoraphobia: Principal | ICD-10-CM

## 2014-06-12 MED ORDER — DIAZEPAM 5 MG PO TABS
5.0000 mg | ORAL_TABLET | Freq: Every day | ORAL | Status: DC | PRN
  Administered 2014-06-13: 5 mg via ORAL
  Filled 2014-06-12: qty 1

## 2014-06-12 MED ORDER — ESCITALOPRAM OXALATE 10 MG PO TABS
10.0000 mg | ORAL_TABLET | Freq: Every day | ORAL | Status: DC
  Administered 2014-06-12 – 2014-06-15 (×4): 10 mg via ORAL
  Filled 2014-06-12 (×6): qty 1
  Filled 2014-06-12 (×2): qty 3

## 2014-06-12 MED ORDER — OLANZAPINE 10 MG PO TBDP
10.0000 mg | ORAL_TABLET | Freq: Three times a day (TID) | ORAL | Status: DC | PRN
  Administered 2014-06-12 – 2014-06-13 (×2): 10 mg via ORAL
  Filled 2014-06-12 (×2): qty 1

## 2014-06-12 MED ORDER — BUSPIRONE HCL 10 MG PO TABS
10.0000 mg | ORAL_TABLET | Freq: Two times a day (BID) | ORAL | Status: DC
  Administered 2014-06-12 – 2014-06-15 (×6): 10 mg via ORAL
  Filled 2014-06-12: qty 6
  Filled 2014-06-12 (×2): qty 1
  Filled 2014-06-12: qty 6
  Filled 2014-06-12 (×2): qty 1
  Filled 2014-06-12 (×2): qty 6
  Filled 2014-06-12 (×4): qty 1

## 2014-06-12 NOTE — Progress Notes (Signed)
D: Pt is currently lying in bed.  She reports that she "wants to leave." She is upset after learning that she will be staying.  She states that she signed a 72 hour release (2days ago on 7/25). This Clinical research associatewriter was unable to find this signed form. A: Pt was presented with a new 72 hr release form to sign (signed at 9:53 a.m.) Physician discussed with patient the benefits/necessities of staying. Support/encouragement given. R: Pt. Continues to be blunted/depressed.

## 2014-06-12 NOTE — [facility] Group Notes (Signed)
[facility] LCSW Group Therapy  06/12/2014 1:15 pm  Type of Therapy: Process Group Therapy  Participation Level:  Did not attend  Summary of Progress/Problems: Today's group addressed the issue of overcoming obstacles.  Patients were asked to identify their biggest obstacle post d/c that stands in the way of their on-going success, and then problem solve as to how to manage this.  [name], [name] 06/12/2014   2:56 PM

## 2014-06-12 NOTE — Progress Notes (Signed)
Nutrition Brief Note  Attempted to meet with pt however staff reported pt was asleep and psychotic and not appropriate to talk with at this time. Nurse tech said that she slept through lunch. Will continue to monitor and attempt to meet with pt later on in the week as schedule allows.   Charlott RakesHeather Myrtice Lowdermilk MS, RD, LDN [phone] Pager [phone] Weekend/After Hours Pager

## 2014-06-12 NOTE — Progress Notes (Signed)
Pt has been resting in bed with eyes closed since the start of this writer's shift at 1900. RR WNL, even and unlabored. Pt has been checked on every 15 mins by MHT as well as frequent checks by nursing staff. Mother called to check on patient and while mother did have patient's 4 digit code, mother's name was not on her visitation sheet to release information. Explained this to mom who asked that it be relayed to patient, mom's desire to be added to consent form. Patient has not woken up thus far tonight. Will continue to monitor patient closely. HS risperdal held due to patient's need for sleep. Pt was also medicated earlier this afternoon. She remains stable and safe. Lawrence MarseillesFriedman, Sondra Blixt Eakes

## 2014-06-12 NOTE — [facility] Group Notes (Signed)
[facility]   06/12/2014 10:05 AM  Participation Quality:  Did not attend    [place], Rozelle Caudle B

## 2014-06-12 NOTE — Progress Notes (Signed)
Patient ID: Kristina GuilesMarcia A Cooper, female   DOB: [birth year], 38 y.o.   MRN: [id number] D  ---  Pt. Denies pain or dis-comfort at this time.    She maintains  A depressed, withdrawn affect and has minimal communication with staff or peers.  She remains in her room and in bed , isolated from other.   She appears anxious but is receptive to staff and takes her medications  With-out issue.   She shows no negative behaviors other that isolation to her room.   Pts. Mother and boyfriend  Visited on unit and are concerned about the pts. Progress  And how long it will take for the new medication to become effective.   A ---  Support and safety cks and meds as ordered.   R  --    Pt. Remains safe on unit

## 2014-06-12 NOTE — Tx Team (Signed)
  Interdisciplinary Treatment Plan Update   Date Reviewed:  06/12/2014  Time Reviewed:  8:26 AM  Progress in Treatment:   Attending groups: Yes Participating in groups: Yes Taking medication as prescribed: Yes  Tolerating medication: Yes Family/Significant other contact made: Yes  Patient understands diagnosis: Yes  Discussing patient identified problems/goals with staff: Yes Medical problems stabilized or resolved: Yes Denies suicidal/homicidal ideation: Yes Patient has not harmed self or others: Yes  For review of initial/current patient goals, please see plan of care.  Estimated Length of Stay:  3-4 days  Reason for Continuation of Hospitalization: Other; describe Reported psychosis Medication stabilization  New Problems/Goals identified:  N/A  Discharge Plan or Barriers:   return home, follow up outpt  Additional Comments:     '' I want you to discharge me today, nothing is wrong with me.''  Objective: Patient is a 38 year old woman who reports long history of anxiety and Panic disorder. She was seeing a doctor who gave her Xanax for 10 years. She reports that her doctor switched her to Ativan over a year ago. Patient states that she was switched to Seroquel by her doctor about 3 weeks but did not take the medication. She was brought to the ED few days ago by her family due to bizarre behavior and increased anxiety, depression and psychotic symptoms. These symptoms include believing that she is pregnant despite her physician telling her she is not, taking her clothes off at inappropriate times, believing people are looking into her house and trying to harm her, responding to people who are not there, staying in bed and not caring for her hygiene or grooming, and, according to parents, periods of confusion and "talking nonsense." Patient reports that she was having panic attacks twice per day before her admission to the hospital. However, patient is denying any symptom  today.   Attendees:  Signature: Thedore MinsMojeed Akintayo, MD 06/12/2014 8:26 AM   Signature: Richelle Itood Razan Siler, LCSW 06/12/2014 8:26 AM  Signature: Fransisca KaufmannLaura Davis, NP 06/12/2014 8:26 AM  Signature: Joslyn Devonaroline Beaudry, RN 06/12/2014 8:26 AM  Signature: Liborio NixonPatrice White, RN 06/12/2014 8:26 AM  Signature:  06/12/2014 8:26 AM  Signature:   06/12/2014 8:26 AM  Signature:    Signature:    Signature:    Signature:    Signature:    Signature:      Scribe for Treatment Team:   Richelle Itood Margarett Viti, LCSW  06/12/2014 8:26 AM

## 2014-06-12 NOTE — Progress Notes (Signed)
[facility] MD Progress Note  06/12/2014 10:49 AM [name]  MRN:  [id number] Subjective: Patient states: '' I want you to discharge me today, nothing is wrong with me.'' Objective: Patient is a 38 year old woman who reports long history of anxiety and Panic disorder. She was seeing a doctor who gave her Xanax for 10 years. She reports that her doctor switched her to Ativan over a year ago. Patient states that she was switched to Seroquel by her doctor about 3 weeks but did not take the medication. She was brought to the ED few days ago by her family due to bizarre behavior and increased anxiety, depression and psychotic symptoms. These symptoms include believing that  she is pregnant despite her physician telling her she is not, taking her clothes off at inappropriate times, believing people are looking into her house and trying to harm her, responding to people who are not there, staying in bed and not caring for her hygiene or grooming, and, according to parents, periods of confusion and "talking nonsense." Patient reports that she was having panic attacks twice per day before her admission to the hospital. However, patient is denying any symptom today. She denies psychosis, delusional thinking, anxiety, depression, suicidal or homicidal thoughts. As a result, she is demanding to be discharged or else threatened to force her way out of the hospital.  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 Endometriosis   . Migraines   . Ovarian cyst    . Pregnancy induced hypertension    Axis IV: other psychosocial or environmental problems and problems related to social environment  ADL's:  Intact  Sleep: Fair  Appetite:  Fair  Suicidal Ideation:  Denies Homicidal Ideation:  Denies AEB (as evidenced by):  Psychiatric Specialty Exam: Physical Exam  Psychiatric: Her speech is normal. Judgment normal. Her mood appears anxious. Her affect is labile. She is aggressive, actively hallucinating and combative. Cognition and memory are normal.    Review of Systems  Constitutional: Negative.   HENT: Negative.   Eyes: Negative.   Respiratory: Negative.   Cardiovascular: Negative.   Gastrointestinal: Negative.   Genitourinary: Negative.   Musculoskeletal: Negative.   Skin: Negative.   Neurological: Negative.   Endo/Heme/Allergies: Negative.   Psychiatric/Behavioral: Positive for hallucinations. The patient is nervous/anxious.     Blood pressure 117/74, pulse 93, temperature 98.5 F (36.9 C), temperature source Oral, resp. rate 16, height 4' 11"  (1.499 m), weight 50.122 kg (110 lb 8 oz).Body mass index is 22.31 kg/(m^2).  General Appearance: Fairly Groomed  Engineer, water::  Minimal  Speech:  Clear and Coherent  Volume:  Normal  Mood:  Anxious and Irritable  Affect:  anxious  Thought Process:  Goal Directed  Orientation:  Full (Time, Place, and Person)  Thought Content:  Negative  Suicidal Thoughts:  No  Homicidal Thoughts:  No  Memory:  Immediate;   Fair Recent;   Fair Remote;   Fair  Judgement:  Impaired  Insight:  Shallow  Psychomotor Activity:  Increased  Concentration:  Fair  Recall:  Fair  Fund of Knowledge:Good  Language: Good  Akathisia:  No  Handed:  Right  AIMS (if indicated):     Assets:  Communication Skills Physical Health  Sleep:  Number of Hours: 6.75   Musculoskeletal: Strength & Muscle Tone: within normal limits Gait & Station: normal Patient leans: N/A  Current Medications: Current  Facility-Administered Medications  Medication Dose Route Frequency Provider Last Rate Last Dose  . acetaminophen (TYLENOL) tablet 650 mg  650 mg Oral Q6H PRN Lurena Nida, NP      . alum & mag hydroxide-simeth (MAALOX/MYLANTA) 200-200-20 MG/5ML suspension 30 mL  30 mL Oral Q4H PRN Lurena Nida, NP      . diazepam (VALIUM) tablet 5 mg  5 mg Oral Daily PRN Quinn Quam      . escitalopram (LEXAPRO) tablet 10 mg  10 mg Oral Daily Sheyanne Munley      . feeding supplement (ENSURE COMPLETE) (ENSURE COMPLETE) liquid 237 mL  237 mL Oral BID BM Aryam Zhan   237 mL at 06/12/14 1000  . magnesium hydroxide (MILK OF MAGNESIA) suspension 30 mL  30 mL Oral Daily PRN Lurena Nida, NP      . OLANZapine zydis (ZYPREXA) disintegrating tablet 10 mg  10 mg Oral Q8H PRN Azriel Jakob      . risperiDONE (RISPERDAL M-TABS) disintegrating tablet 2 mg  2 mg Oral QHS Lurena Nida, NP   2 mg at 06/10/14 2307    Lab Results:  Results for orders placed during the hospital encounter of 06/10/14 (from the past 48 hour(s))  CBC WITH DIFFERENTIAL     Status: None   Collection Time    06/10/14  4:23 PM      Result Value Ref Range   WBC 7.5  4.0 - 10.5 K/uL   RBC 4.57  3.87 - 5.11 MIL/uL   Hemoglobin 15.0  12.0 - 15.0 g/dL   HCT 43.0  36.0 - 46.0 %   MCV 94.1  78.0 - 100.0 fL   MCH 32.8  26.0 - 34.0 pg   MCHC 34.9  30.0 - 36.0 g/dL   RDW 12.5  11.5 - 15.5 %   Platelets 261  150 - 400 K/uL   Neutrophils Relative % 64  43 - 77 %   Neutro Abs 4.8  1.7 - 7.7 K/uL   Lymphocytes Relative 27  12 - 46 %   Lymphs Abs 2.0  0.7 - 4.0 K/uL   Monocytes Relative 7  3 - 12 %   Monocytes Absolute 0.5  0.1 - 1.0 K/uL   Eosinophils Relative 2  0 - 5 %   Eosinophils Absolute 0.1  0.0 - 0.7 K/uL   Basophils Relative 0  0 - 1 %   Basophils Absolute 0.0  0.0 - 0.1 K/uL  COMPREHENSIVE METABOLIC PANEL     Status: Abnormal   Collection Time    06/10/14  4:23 PM      Result Value Ref Range   Sodium 139  137 - 147 mEq/L    Potassium 4.4  3.7 - 5.3 mEq/L   Chloride 101  96 - 112 mEq/L   CO2 23  19 - 32 mEq/L   Glucose, Bld 104 (*) 70 - 99 mg/dL   BUN 16  6 - 23 mg/dL   Creatinine, Ser 1.03  0.50 - 1.10 mg/dL   Calcium 9.2  8.4 - 10.5 mg/dL   Total Protein 7.1  6.0 - 8.3 g/dL   Albumin  4.0  3.5 - 5.2 g/dL   AST 22  0 - 37 U/L   ALT 30  0 - 35 U/L   Alkaline Phosphatase 62  39 - 117 U/L   Total Bilirubin 0.3  0.3 - 1.2 mg/dL   GFR calc non Af Amer 69 (*) >90 mL/min   GFR calc Af Amer 79 (*) >90 mL/min   Comment: (NOTE)     The eGFR has been calculated using the CKD EPI equation.     This calculation has not been validated in all clinical situations.     eGFR's persistently <90 mL/min signify possible Chronic Kidney     Disease.   Anion gap 15  5 - 15  ETHANOL     Status: None   Collection Time    06/10/14  4:23 PM      Result Value Ref Range   Alcohol, Ethyl (B) <11  0 - 11 mg/dL   Comment:            LOWEST DETECTABLE LIMIT FOR     SERUM ALCOHOL IS 11 mg/dL     FOR MEDICAL PURPOSES ONLY  URINE RAPID DRUG SCREEN (HOSP PERFORMED)     Status: None   Collection Time    06/10/14  6:30 PM      Result Value Ref Range   Opiates NONE DETECTED  NONE DETECTED   Cocaine NONE DETECTED  NONE DETECTED   Benzodiazepines NONE DETECTED  NONE DETECTED   Amphetamines NONE DETECTED  NONE DETECTED   Tetrahydrocannabinol NONE DETECTED  NONE DETECTED   Barbiturates NONE DETECTED  NONE DETECTED   Comment:            DRUG SCREEN FOR MEDICAL PURPOSES     ONLY.  IF CONFIRMATION IS NEEDED     FOR ANY PURPOSE, NOTIFY LAB     WITHIN 5 DAYS.                LOWEST DETECTABLE LIMITS     FOR URINE DRUG SCREEN     Drug Class       Cutoff (ng/mL)     Amphetamine      1000     Barbiturate      200     Benzodiazepine   371     Tricyclics       062     Opiates          300     Cocaine          300     THC              50  PREGNANCY, URINE     Status: None   Collection Time    06/10/14  6:30 PM      Result Value Ref  Range   Preg Test, Ur NEGATIVE  NEGATIVE   Comment:            THE SENSITIVITY OF THIS     METHODOLOGY IS >20 mIU/mL.  URINALYSIS, ROUTINE W REFLEX MICROSCOPIC     Status: None   Collection Time    06/10/14  6:30 PM      Result Value Ref Range   Color, Urine YELLOW  YELLOW   APPearance CLEAR  CLEAR   Specific Gravity, Urine 1.022  1.005 - 1.030   pH 6.5  5.0 - 8.0   Glucose, UA NEGATIVE  NEGATIVE mg/dL   Hgb urine dipstick NEGATIVE  NEGATIVE   Bilirubin  Urine NEGATIVE  NEGATIVE   Ketones, ur NEGATIVE  NEGATIVE mg/dL   Protein, ur NEGATIVE  NEGATIVE mg/dL   Urobilinogen, UA 0.2  0.0 - 1.0 mg/dL   Nitrite NEGATIVE  NEGATIVE   Leukocytes, UA NEGATIVE  NEGATIVE   Comment: MICROSCOPIC NOT DONE ON URINES WITH NEGATIVE PROTEIN, BLOOD, LEUKOCYTES, NITRITE, OR GLUCOSE <1000 mg/dL.    Physical Findings: AIMS: Facial and Oral Movements Muscles of Facial Expression: None, normal Lips and Perioral Area: None, normal Jaw: None, normal Tongue: None, normal,Extremity Movements Upper (arms, wrists, hands, fingers): None, normal Lower (legs, knees, ankles, toes): None, normal, Trunk Movements Neck, shoulders, hips: None, normal, Overall Severity Severity of abnormal movements (highest score from questions above): None, normal Incapacitation due to abnormal movements: None, normal Patient's awareness of abnormal movements (rate only patient's report): No Awareness, Dental Status Current problems with teeth and/or dentures?: No Does patient usually wear dentures?: No  CIWA:    COWS:     Treatment Plan Summary: Daily contact with patient to assess and evaluate symptoms and progress in treatment Medication management  Plan:1. Admit for crisis management and stabilization. 2. Medication management to reduce current symptoms to base line and improve the patient's overall level of functioning: -Initiate Lexapro 38m daily for anxiety/panic disorder -Continue Risperidone 238mpo Qhs for  psychosis -Valium 65m60mo daily as needed for anxiety -Initiate Buspar 24m30m BID for anxiety. -Zyprexa Zydis 24mg70m as needed for agitation. 3. Treat health problems as indicated. 4. Develop treatment plan to decrease risk of relapse upon discharge and the need for     readmission. 5. Psycho-social education regarding relapse prevention and self care. 6. Health care follow up as needed for medical problems. 7. Restart home medications where appropriate.  Medical Decision Making Problem Points:  Established problem, improving (1), Review of last therapy session (1) and Review of psycho-social stressors (1) Data Points:  Order Aims Assessment (2) Review or order clinical lab tests (1) Review of medication regiment & side effects (2) Review of new medications or change in dosage (2)  I certify that inpatient services furnished can reasonably be expected to improve the patient's condition.   AkintCorena Pilgrim7/27/2015, 10:49 AM

## 2014-06-13 NOTE — Progress Notes (Signed)
Patient ID: Kristina GuilesMarcia A Cooper, female   DOB: February 08, 1976, 38 y.o.   MRN: [id number] Pt with limited visibility in the milieu; isolative.  Difficult to engage in conversation. Not attending groups.  Needs assessed.  Pt denied.  Fifteen minute checks in progress for patient safety.  Pt safe on unit.

## 2014-06-13 NOTE — Progress Notes (Signed)
Psychoeducational Group Note  Date:  06/13/2014 Time:  1155  Group Topic/Focus:  Recovery Goals:   The focus of this group is to identify appropriate goals for recovery and establish a plan to achieve them.  Participation Level: Did Not Attend  Participation Quality:  Not Applicable  Affect:  Not Applicable  Cognitive:  Not Applicable  Insight:  Not Applicable  Engagement in Group: Not Applicable  Additional Comments:  Pt refused to attend group this morning.  Jailan Trimm E 06/13/2014, 11:59 AM

## 2014-06-13 NOTE — Progress Notes (Signed)
Nutrition Brief Note   Attempted to meet with pt, went into room with RN, pt kept eyes closed and not interested in talking. Per RN, pt has not been drinking Ensure. RN reported that family said pt had lost some weight PTA, unsure how much. RN unsure how well pt is eating.   Charlott RakesHeather Darnell Stimson MS, RD, LDN [phone] Pager [phone] Weekend/After Hours Pager

## 2014-06-13 NOTE — Progress Notes (Addendum)
Patient ID: [name], female [name]  DOB: 05-21-1976, 38 y.o.   MRN: [id number] D. Patient presents with disorganized thoughts, anxious mood. [name] continues to state ''I'm ready for discharge, requesting to leave. '' Noted that patient with bizarre gait while walking down hallway, as if responding to visual hallucinations. Pt refused to complete self inventory. Support and encouragement provided. R. Will continue to monitor q 15 minutes for safety.

## 2014-06-13 NOTE — Progress Notes (Signed)
D: Pt guarded and isolative.  She is focused of discharge.  Pt. Receiving scheduled meds.  Minimal interaction with staff and peers. A: Support/encouragement given. R: Pt.contracts for safety.

## 2014-06-13 NOTE — [facility] Group Notes (Signed)
[facility] LCSW Group Therapy  06/13/2014 2:49 PM   Type of Therapy:  Group Therapy  Participation Level: Did not attend  Summary of Progress/Problems: Today's group focused on relapse prevention.  We defined the term, and then brainstormed on ways to prevent relapse.  Daryel Geraldorth, Maygen Sirico B 06/13/2014 , 2:49 PM

## 2014-06-13 NOTE — Progress Notes (Signed)
D: Pt in bed resting with eyes closed. Respirations even and unlabored. Pt appears to be in no signs of distress at this time. A: Q15min checks remains for this pt. R: Pt remains safe at this time.   

## 2014-06-13 NOTE — Progress Notes (Signed)
Patient ID: Kristina GuilesMarcia A Cooper, female   DOB: June 01, 1976, 38 y.o.   MRN: [id number] [facility]  06/13/2014 11:02 AM [name]  MRN:  [id number] Subjective: Patient states: '' I am ready to be discharged today, my medications are working.''' Objective: Patient was seen and her chart reviewed. Patient reports decreased anxiety panic attacks delusions and psychosis. She is guarded self isolating and withdrawn. Patient has not been participating in the nit milieu and has been partially compliant with her medications. She is demanding to be discharged or else threatened to force her way out of the hospital.  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 Endometriosis   . Migraines   . Ovarian cyst   . Pregnancy induced hypertension    Axis IV: other psychosocial or environmental problems and problems related to social environment  ADL's:  Intact  Sleep: Fair  Appetite:  Fair  Suicidal Ideation:  Denies Homicidal Ideation:  Denies AEB (as evidenced by):  Psychiatric Specialty Exam: Physical Exam  Psychiatric: Her speech is normal. Judgment normal. Her mood appears anxious. Her affect is labile. She is aggressive, actively hallucinating and combative. Cognition and memory are normal.    Review of Systems  Constitutional: Negative.   HENT: Negative.   Eyes: Negative.   Respiratory: Negative.   Cardiovascular: Negative.   Gastrointestinal: Negative.   Genitourinary: Negative.   Musculoskeletal: Negative.    Skin: Negative.   Neurological: Negative.   Endo/Heme/Allergies: Negative.   Psychiatric/Behavioral: Positive for hallucinations. The patient is nervous/anxious.     Blood pressure 117/74, pulse 93, temperature 98.5 F (36.9 C), temperature source Oral, resp. rate 16, height 4\' 11"  (1.499 m), weight 50.122 kg (110 lb 8 oz).Body mass index is 22.31 kg/(m^2).  General Appearance: Fairly Groomed  Patent attorneyye Contact::  Minimal  Speech:  Clear and Coherent  Volume:  Normal  Mood:  Anxious and Irritable  Affect:  anxious  Thought Process:  Goal Directed  Orientation:  Full (Time, Place, and Person)  Thought Content:  Negative  Suicidal Thoughts:  No  Homicidal Thoughts:  No  Memory:  Immediate;   Fair Recent;   Fair Remote;   Fair  Judgement:  Impaired  Insight:  Shallow  Psychomotor Activity:  Increased  Concentration:  Fair  Recall:  FiservFair  Fund of Knowledge:Good  Language: Good  Akathisia:  No  Handed:  Right  AIMS (if indicated):     Assets:  Communication Skills Physical Health  Sleep:  Number of Hours: 5.75   Musculoskeletal: Strength & Muscle Tone: within normal limits Gait & Station: normal Patient leans: N/A  Current Medications: Current Facility-Administered Medications  Medication Dose Route Frequency Provider Last Rate Last Dose  . acetaminophen (TYLENOL) tablet 650 mg  650 mg Oral Q6H PRN Kristeen MansFran E Hobson, NP      . alum & mag hydroxide-simeth (MAALOX/MYLANTA) 200-200-20 MG/5ML suspension 30 mL  30 mL Oral Q4H PRN Kristeen MansFran E Hobson, NP      . busPIRone (BUSPAR) tablet 10 mg  10 mg Oral BID Itzell Bendavid  10 mg at 06/13/14 0945  . diazepam (VALIUM) tablet 5 mg  5 mg Oral Daily PRN Alexavier Tsutsui      . escitalopram (LEXAPRO) tablet 10 mg  10 mg Oral Daily Philomina Leon   10 mg at 06/13/14 0945  . feeding supplement (ENSURE COMPLETE) (ENSURE COMPLETE) liquid 237 mL  237 mL Oral BID BM Karo Rog   237 mL at 06/12/14 1000  . magnesium hydroxide (MILK OF MAGNESIA)  suspension 30 mL  30 mL Oral Daily PRN Kristeen Mans, NP      . OLANZapine zydis (ZYPREXA) disintegrating tablet 10 mg  10 mg Oral Q8H PRN Stonewall Doss   10 mg at 06/12/14 1057  . risperiDONE (RISPERDAL M-TABS) disintegrating tablet 2 mg  2 mg Oral QHS Kristeen Mans, NP   2 mg at 06/12/14 2145    Lab Results:  No results found for this or any previous visit (from the past 48 hour(s)).  Physical Findings: AIMS: Facial and Oral Movements Muscles of Facial Expression: None, normal Lips and Perioral Area: None, normal Jaw: None, normal Tongue: None, normal,Extremity Movements Upper (arms, wrists, hands, fingers): None, normal Lower (legs, knees, ankles, toes): None, normal, Trunk Movements Neck, shoulders, hips: None, normal, Overall Severity Severity of abnormal movements (highest score from questions above): None, normal Incapacitation due to abnormal movements: None, normal Patient's awareness of abnormal movements (rate only patient's report): No Awareness, Dental Status Current problems with teeth and/or dentures?: No Does patient usually wear dentures?: No  CIWA:    COWS:     Treatment Plan Summary: Daily contact with patient to assess and evaluate symptoms and progress in treatment Medication management  Plan:1. Admit for crisis management and stabilization. 2. Medication management to reduce current symptoms to base line and improve the patient's overall level of functioning: -Continue Lexapro 10mg  daily for anxiety/panic disorder -Continue Risperidone 2mg  po Qhs for psychosis -Valium 5mg  po daily as needed for anxiety -Continue Buspar 10mg  po BID for anxiety. -Zyprexa Zydis 10mg  tid as needed for agitation. 3. Treat health problems as indicated. 4. Develop treatment plan to decrease risk of relapse upon discharge and the need for     readmission. 5. Psycho-social education regarding relapse prevention and self care. 6. Health care follow up as needed for medical  problems.   Medical Decision Making Problem Points:  Established problem, improving (1), Review of last therapy session (1) and Review of psycho-social stressors (1) Data Points:  Order Aims Assessment (2) Review or order clinical lab tests (1) Review of medication regiment & side effects (2) Review of new medications or change in dosage (2)  I certify that inpatient services furnished can reasonably be expected to improve the patient's condition.   Thedore Mins, MD 06/13/2014, 11:02 AM

## 2014-06-14 NOTE — Progress Notes (Signed)
Patient ID: Kristina GuilesMarcia A Burd, female   DOB: 1976/04/03, 38 y.o.   MRN: [id number] [facility]  06/14/2014 11:38 AM Kristina GuilesMarcia A Cuaresma  MRN:  [id number] Subjective: Patient states: '' I am doing much better on my medications except for feeling tired and sleepy.''  Objective: Patient was seen and her chart reviewed. Patient reports that she has been feeling better and continues to endorse decreased anxiety panic attacks and delusions. Patient denies auditory/visual hallucination, suicidal or homicidal ideation, intent or plan.  Patient has not been participating in the nit milieu but has been fully compliant with her medications. She is demanding to be discharged or else threatened to force her way out of the hospital.  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 Endometriosis   . Migraines   . Ovarian cyst   . Pregnancy induced hypertension    Axis IV: other psychosocial or environmental problems and problems related to social environment  ADL's:  Intact  Sleep: Fair  Appetite:  Fair  Suicidal Ideation:  Denies Homicidal Ideation:  Denies AEB (as evidenced by):  Psychiatric Specialty Exam: Physical Exam  Psychiatric: Her speech is normal. Judgment normal. Her mood appears anxious. Her affect is labile. She is aggressive, actively hallucinating and combative. Cognition and memory are normal.    Review of Systems  Constitutional: Negative.   HENT: Negative.   Eyes: Negative.   Respiratory: Negative.    Cardiovascular: Negative.   Gastrointestinal: Negative.   Genitourinary: Negative.   Musculoskeletal: Negative.   Skin: Negative.   Neurological: Negative.   Endo/Heme/Allergies: Negative.   Psychiatric/Behavioral: Positive for hallucinations. The patient is nervous/anxious.     Blood pressure 112/59, pulse 103, temperature 98.7 F (37.1 C), temperature source Oral, resp. rate 16, height 4\' 11"  (1.499 m), weight 50.122 kg (110 lb 8 oz).Body mass index is 22.31 kg/(m^2).  General Appearance: Fairly Groomed  Patent attorneyye Contact::  Minimal  Speech:  Clear and Coherent  Volume:  Normal  Mood:  Anxious and Irritable  Affect:  anxious  Thought Process:  Goal Directed  Orientation:  Full (Time, Place, and Person)  Thought Content:  Negative  Suicidal Thoughts:  No  Homicidal Thoughts:  No  Memory:  Immediate;   Fair Recent;   Fair Remote;   Fair  Judgement:  Impaired  Insight:  Shallow  Psychomotor Activity:  Increased  Concentration:  Fair  Recall:  FiservFair  Fund of Knowledge:Good  Language: Good  Akathisia:  No  Handed:  Right  AIMS (if indicated):     Assets:  Communication Skills Physical Health  Sleep:  Number of Hours: 6.75   Musculoskeletal: Strength & Muscle Tone: within normal limits Gait & Station: normal Patient leans: N/A  Current Medications: Current Facility-Administered Medications  Medication Dose Route Frequency Provider Last Rate Last Dose  . acetaminophen (TYLENOL) tablet 650 mg  650 mg Oral Q6H PRN Kristeen MansFran E Hobson, NP   650 mg at 06/13/14 1951  . alum & mag hydroxide-simeth (MAALOX/MYLANTA) 200-200-20 MG/5ML suspension 30 mL  30 mL Oral Q4H PRN Angelina OkFran E  Link Snuffer, NP      . busPIRone (BUSPAR) tablet 10 mg  10 mg Oral BID Meia Emley   10 mg at 06/14/14 0928  . diazepam (VALIUM) tablet 5 mg  5 mg Oral Daily PRN Kamelia Lampkins   5 mg at 06/13/14 1623  . escitalopram (LEXAPRO) tablet 10 mg  10 mg Oral Daily Jasier Calabretta   10 mg at 06/14/14 0928  . feeding  supplement (ENSURE COMPLETE) (ENSURE COMPLETE) liquid 237 mL  237 mL Oral BID BM Coral Soler   237 mL at 06/14/14 0929  . magnesium hydroxide (MILK OF MAGNESIA) suspension 30 mL  30 mL Oral Daily PRN Kristeen Mans, NP      . OLANZapine zydis (ZYPREXA) disintegrating tablet 10 mg  10 mg Oral Q8H PRN Shundra Wirsing   10 mg at 06/13/14 1623  . risperiDONE (RISPERDAL M-TABS) disintegrating tablet 2 mg  2 mg Oral QHS Kristeen Mans, NP   2 mg at 06/13/14 2153    Lab Results:  No results found for this or any previous visit (from the past 48 hour(s)).  Physical Findings: AIMS: Facial and Oral Movements Muscles of Facial Expression: None, normal Lips and Perioral Area: None, normal Jaw: None, normal Tongue: None, normal,Extremity Movements Upper (arms, wrists, hands, fingers): None, normal Lower (legs, knees, ankles, toes): None, normal, Trunk Movements Neck, shoulders, hips: None, normal, Overall Severity Severity of abnormal movements (highest score from questions above): None, normal Incapacitation due to abnormal movements: None, normal Patient's awareness of abnormal movements (rate only patient's report): No Awareness, Dental Status Current problems with teeth and/or dentures?: No Does patient usually wear dentures?: No  CIWA:    COWS:     Treatment Plan Summary: Daily contact with patient to assess and evaluate symptoms and progress in treatment Medication management  Plan:1. Admit for crisis management and stabilization. 2. Medication management to reduce current symptoms to base line and improve the patient's overall level of functioning: -Continue Lexapro 10mg  daily for anxiety/panic disorder -Continue Risperidone 2mg  po Qhs for psychosis -Valium 5mg  po daily as needed for anxiety -Continue Buspar 10mg  po BID for anxiety. -Zyprexa Zydis 10mg  tid as needed for agitation. 3. Treat health problems as indicated. 4. Develop treatment plan to decrease risk of relapse upon  discharge and the need for     readmission. 5. Psycho-social education regarding relapse prevention and self care. 6. Health care follow up as needed for medical problems.   Medical Decision Making Problem Points:  Established problem, improving (1), Review of last therapy session (1) and Review of psycho-social stressors (1) Data Points:  Order Aims Assessment (2) Review or order clinical lab tests (1) Review of medication regiment & side effects (2) Review of new medications or change in dosage (2)  I certify that inpatient services furnished can reasonably be expected to improve the patient's condition.   Thedore Mins, MD 06/14/2014, 11:38 AM

## 2014-06-14 NOTE — Progress Notes (Signed)
D: Pt is denying any SI/HI/AVH. Pt verbally denies any anxiety or depression. Pt is denying any concerns she wishes for this writer to address at this time. Pt present for group this evening.  A: Writer administered scheduled medications to pt, per MD orders. Continued support and availability as needed was extended to this pt. Staff continue to monitor pt with q7115min checks.  R: No adverse drug reactions noted. Pt receptive to treatment. Pt remains safe at this time.

## 2014-06-14 NOTE — [facility] Group Notes (Signed)
[facility]  06/14/2014 , 1:03 PM    Type of Therapy:  [employer] Presentation  Participation Level:  Was eating lunch in the group room.  Got up and left when she was done.  Summary of Progress/Problems:  Onalee HuaDavid from [employer] came to present his recovery story and play the guitar.    Daryel Geraldorth, Oreoluwa Gilmer B 06/14/2014 , 1:03 PM

## 2014-06-14 NOTE — Progress Notes (Signed)
Patient ID: Kristina GuilesMarcia A Cooper, female   DOB: 1976/07/11, 38 y.o.   MRN: [id number]  D: Pt. Denies SI/HI and A/V Hallucinations. Patient does not report any pain or discomfort at this time. Patient reports that she wants to go home but patient is not vested in her treatment at this time.  A: Support and encouragement provided to the patient to attend groups. Scheduled medications administered to patient but late due to patient not getting out of bed this morning.   R: Patient is receptive and cooperative but minimal and isolative. Patient is not attending groups and did not want to eat lunch today. Q15 minute checks are maintained for safety.

## 2014-06-14 NOTE — Progress Notes (Signed)
  D: Pt observed sleeping in bed with eyes closed. RR even and unlabored. No distress noted  .  A: Q 15 minute checks were done for safety.  R: safety maintained on unit.  

## 2014-06-14 NOTE — Progress Notes (Signed)
Adult Psychoeducational Group Note  Date:  06/14/2014 Time:  8:54 PM  Group Topic/Focus:  Wrap-Up Group:   The focus of this group is to help patients review their daily goal of treatment and discuss progress on daily workbooks.  Participation Level:  Active  Participation Quality:  Appropriate and Attentive  Affect:  Appropriate  Cognitive:  Appropriate  Insight: Appropriate  Engagement in Group:  Engaged  Modes of Intervention:  Discussion  Additional Comments:  Pt attended the wrap up group this evening and remained appropriate and engaged throughout the duration of the group. Pt ranked her day as a 10 because she gets to go home tomorrow. Pt also shared that something positive about her day was the fact that she got to visit with her family. Also, pt shared that she has met a lot of interesting people here that have the same struggles as her and this has really helped her.  Beryle Beams 06/14/2014, 8:54 PM

## 2014-06-15 MED ORDER — BUSPIRONE HCL 10 MG PO TABS: 10.0000 mg | ORAL_TABLET | Freq: Two times a day (BID) | ORAL | Status: DC

## 2014-06-15 MED ORDER — ESCITALOPRAM OXALATE 10 MG PO TABS: 10.0000 mg | ORAL_TABLET | Freq: Every day | ORAL | Status: DC

## 2014-06-15 MED ORDER — RISPERIDONE 2 MG PO TBDP: 2.0000 mg | ORAL_TABLET | Freq: Every day | ORAL | Status: DC

## 2014-06-15 NOTE — [facility] Suicide Risk Assessment (Signed)
   Demographic Factors:  Caucasian, Low socioeconomic status, Unemployed and female  Total Time spent with patient: 20 minutes  Psychiatric Specialty Exam: Physical Exam  Psychiatric: She has a normal mood and affect. Her speech is normal and behavior is normal. Judgment and thought content normal. Cognition and memory are normal.    Review of Systems  Constitutional: Negative.   HENT: Negative.   Eyes: Negative.   Respiratory: Negative.   Cardiovascular: Negative.   Gastrointestinal: Negative.   Genitourinary: Negative.   Musculoskeletal: Negative.   Skin: Negative.   Neurological: Negative.   Endo/Heme/Allergies: Negative.   Psychiatric/Behavioral: Negative.     Blood pressure 109/70, pulse 102, temperature 97.9 F (36.6 C), temperature source Oral, resp. rate 17, height 4\' 11"  (1.499 m), weight 50.122 kg (110 lb 8 oz).Body mass index is 22.31 kg/(m^2).  General Appearance: Fairly Groomed  Patent attorneyye Contact::  Negative  Speech:  Clear and Coherent  Volume:  Normal  Mood:  Euthymic  Affect:  Appropriate  Thought Process:  Goal Directed  Orientation:  Full (Time, Place, and Person)  Thought Content:  Negative  Suicidal Thoughts:  No  Homicidal Thoughts:  No  Memory:  Immediate;   Fair Recent;   Fair Remote;   Fair  Judgement:  Fair  Insight:  Fair  Psychomotor Activity:  Normal  Concentration:  Fair  Recall:  [employer]iservFair  Fund of Knowledge:Fair  Language: Good  Akathisia:  No  Handed:  Right  AIMS (if indicated):     Assets:  Communication Skills Desire for Improvement Physical Health  Sleep:  Number of Hours: 6.75    Musculoskeletal: Strength & Muscle Tone: within normal limits Gait & Station: normal Patient leans: N/A   Mental Status Per Nursing Assessment::   On Admission:  Suicidal ideation indicated by patient (no plan or intent)  Current Mental Status by Physician: patient denies suicidal ideation, intent or plan  Loss Factors: NA  Historical  Factors: NA  Risk Reduction Factors:   Sense of responsibility to family and Positive social support  Continued Clinical Symptoms:  Resolving anxiety and panic attacks  Cognitive Features That Contribute To Risk:  Closed-mindedness Polarized thinking    Suicide Risk:  Minimal: No identifiable suicidal ideation.  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 Endometriosis   . Migraines   . Ovarian cyst   . Anxiety   . Pregnancy induced hypertension    AXIS IV:  other psychosocial or environmental problems and problems related to social environment AXIS V:  61-70 mild symptoms  Plan Of Care/Follow-up recommendations:  Activity:  as tolerated Diet:  healthy Tests:  routine Other:  patient to keep her after care appointment  Is patient on multiple antipsychotic therapies at discharge:  No   Has Patient had three or more failed trials of antipsychotic monotherapy by history:  No  Recommended Plan for Multiple Antipsychotic Therapies: NA    Thedore MinsAkintayo, Wylder Macomber, MD 06/15/2014, 9:22 AM

## 2014-06-15 NOTE — [facility] Group Notes (Signed)
[facility] Group Notes:  (Nursing/MHT/Case Management/Adjunct)  Date:  06/15/2014  Time:  9:29 AM  Type of Therapy:  Nurse Education  Participation Level:  Minimal  Participation Quality:  Appropriate and Attentive  Affect:  Appropriate  Cognitive:  Alert and Appropriate  Insight:  None  Engagement in Group:  Engaged  Modes of Intervention:  Discussion and Education  Summary of Progress/Problems: The point of this group is to discuss the daily subject; Leisure and Lifestyle Changes. RN discusses different ways to begin lifestyle changes and different leisure activities.   Marzetta BoardDopson, Maelin Kurkowski E 06/15/2014, 9:29 AM

## 2014-06-15 NOTE — Discharge Summary (Signed)
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   HENT: Negative.   Eyes: Negative.   Respiratory: Negative.   Cardiovascular: Negative.   Gastrointestinal: Negative.   Genitourinary: Negative.   Musculoskeletal: Negative.   Skin: Negative.   Neurological: Negative.   Endo/Heme/Allergies: Negative.   Psychiatric/Behavioral: Negative.     Blood pressure 109/70, pulse 102, temperature 97.9 F (36.6 C), temperature source Oral, resp. rate 17, height 4\' 11"  (1.499 m), weight 50.122 kg (110 lb 8 oz).Body mass index is 22.31 kg/(m^2).  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  Endometriosis    .  Migraines    .  Ovarian cyst    .  Anxiety    .  Pregnancy induced hypertension    AXIS IV: other psychosocial or environmental problems and problems related to social environment  AXIS V: 61-70 mild symptoms  Level of Care:  OP  Hospital Course:   [name] is an 38 y.o. female, divorced, Caucasian who presented to [facility] ED accompanied by her parents prior to admission in Unit.  Pt reports she has been in outpatient treatment for anxiety for years with Dr. Milagros Evener. Pt says she was on Xanax for almost ten years, then Xanax was replaced with Ativan a year and a half ago. Approximately five weeks ago Pt was taken off Ativan and put on Seroquel. Pt report approximately three weeks ago Pt began have increased anxiety, depression and psychotic symptoms. These symptoms include believing she is pregnant despite her physician telling her she is not, taking her clothes off at inappropriate times, believing people are looking into her house and trying to harm her, responding to people who are not there, staying in bed and not caring for her hygiene or grooming, and, according to parents, periods of confusion and "talking nonsense." Pt states she started very vivid dreams and often cannot distinguish between what is real and what is a dream. She reports started having panic attacks twice per day and is sleeping excessively in order to avoid stressors. Pt states she feels she Was talking too fast.also " She reports other symptoms including shaking, crying spells, poor appetite, irritability and feelings of hopelessness and "lack of confidence." She reports passive suicidal ideation with no plan or intent,  stating is she wasn't here she wouldn't have to deal with this. She denies any history of suicidal gestures. She denies any homicidal ideation or history of violence. She denies any history of alcohol or substance abuse. According to both Pt she has never had these symptoms in the past.          [name] was admitted to the adult 400 unit where she was evaluated and her symptoms were identified. Medication management was discussed and implemented. Patient was started on Buspar for anxiety, Lexapro for depression, and Risperdal M-tab for  psychosis. The patient had recently been taken off Ativan and switched to antipsychotic medication. She was encouraged to participate in unit programming. Medical problems were identified and treated appropriately. Home medication was restarted as needed. She was evaluated each day by a clinical provider to ascertain the patient's response to treatment.  Improvement was noted by the patient's report of decreasing symptoms, improved sleep and appetite, affect, medication tolerance, behavior, and participation in unit programming.  The patient was asked each day to complete a self inventory noting mood, mental status, pain, new symptoms, anxiety and concerns.         She responded well to medication and being in a therapeutic and supportive environment. Positive and appropriate behavior was noted and the patient was motivated for recovery.  She worked closely with the treatment team and case manager to develop a discharge plan with appropriate goals. Coping skills, problem solving as well as relaxation therapies were also part of the unit programming.         By the day of discharge she was in much improved condition than upon admission.  Symptoms were reported as significantly decreased or resolved completely.  The patient denied SI/HI and voiced no AVH. She was motivated to continue taking medication with a goal of continued improvement in mental health. [name] was discharged home with a plan to follow up as noted below.  Consults:  None  Significant Diagnostic Studies:  Chemistry panel, CBC,  UDS negative  Discharge Vitals:   Blood pressure 109/70, pulse 102, temperature 97.9 F (36.6 C), temperature source Oral, resp. rate 17, height 4\' 11"  (1.499 m), weight 50.122 kg (110 lb 8 oz). Body mass index is 22.31 kg/(m^2). Lab Results:   No results found for this or any previous visit (from the past 72 hour(s)).  Physical Findings: AIMS: Facial and Oral Movements Muscles of Facial Expression:  None, normal Lips and Perioral Area: None, normal Jaw: None, normal Tongue: None, normal,Extremity Movements Upper (arms, wrists, hands, fingers): None, normal Lower (legs, knees, ankles, toes): None, normal, Trunk Movements Neck, shoulders, hips: None, normal, Overall Severity Severity of abnormal movements (highest score from questions above): None, normal Incapacitation due to abnormal movements: None, normal Patient's awareness of abnormal movements (rate only patient's report): No Awareness, Dental Status Current problems with teeth and/or dentures?: No Does patient usually wear dentures?: No  CIWA:    COWS:     Psychiatric Specialty Exam: See Psychiatric Specialty Exam and Suicide Risk Assessment completed by Attending Physician prior to discharge.  Discharge destination:  Home  Is patient on multiple antipsychotic therapies at discharge:  No   Has Patient had three or more failed trials of antipsychotic monotherapy by history:  No  Recommended Plan for Multiple Antipsychotic Therapies: NA     Medication List    STOP taking these medications       LORazepam 2 MG tablet  Commonly known as:  ATIVAN      TAKE these medications     Indication   busPIRone 10 MG tablet  Commonly known as:  BUSPAR  Take 1 tablet (10 mg total) by mouth 2 (two) times daily.   Indication:  Anxiety Disorder     escitalopram 10 MG tablet  Commonly known as:  LEXAPRO  Take 1 tablet (10 mg total) by mouth daily.   Indication:  Generalized Anxiety Disorder, Panic disorder     risperiDONE 2 MG disintegrating tablet  Commonly known as:  RISPERDAL M-TABS  Take 1 tablet (2 mg total) by mouth at bedtime.   Indication:  Easily Angered or Annoyed           Follow-up Information   Follow up with Dr Evelene CroonKaur On 06/16/2014. (Friday at 5PM with Dr Evelene CroonKaur)    Contact information:   [address]  [place]  [336] [phone]      Follow-up recommendations:   Activity: as tolerated  Diet:  healthy  Tests: routine  Other: patient to keep her after care appointment   Comments:  Take all your medications as prescribed by your mental healthcare provider.  Report any adverse effects and or reactions from your medicines to your outpatient provider promptly.  Patient is instructed and cautioned to not engage in alcohol and or illegal drug use while on prescription medicines.  In the event of worsening symptoms, patient is instructed to call the crisis hotline, 911 and or go to the nearest ED for appropriate evaluation and treatment of symptoms.  Follow-up with your primary care provider for your other medical issues, concerns and or health care needs.   Total Discharge Time:  Greater than 30 minutes.  SignedFransisca Kaufmann: DAVIS, LAURA NP-C 06/15/2014, 9:22 AM  Patient seen, evaluated and I agree with notes by Nurse Practitioner. Thedore MinsMojeed Jabe Jeanbaptiste, MD

## 2014-06-15 NOTE — [facility] Group Notes (Signed)
[facility] Group Notes:  (Counselor/Nursing/MHT/Case Management/Adjunct)  06/15/2014 1:15PM  Type of Therapy:  Group Therapy  Participation Level: Did not attend  Summary of Progress/Problems: The topic for group was balance in life.  Pt participated in the discussion about when their life was in balance and out of balance and how this feels.  Pt discussed ways to get back in balance and short term goals they can work on to get where they want to be.    Daryel Geraldorth, Bailie Christenbury B 06/15/2014 2:29 PM

## 2014-06-15 NOTE — [facility] Suicide Risk Assessment (Signed)
[facility] INPATIENT:  Family/Significant Other Suicide Prevention Education  Suicide Prevention Education:  Education Completed; Brooks SailorsDonna Hedrick, mother, [phone]  has been identified by the patient as the family member/significant other with whom the patient will be residing, and identified as the person(s) who will aid the patient in the event of a mental health crisis (suicidal ideations/suicide attempt).  With written consent from the patient, the family member/significant other has been provided the following suicide prevention education, prior to the and/or following the discharge of the patient.  The suicide prevention education provided includes the following:  Suicide risk factors  Suicide prevention and interventions  National Suicide Hotline telephone number  [facility] assessment telephone number  [facility] Emergency Assistance 911  [facility] and/or Residential Mobile Crisis Unit telephone number  Request made of family/significant other to:  Remove weapons (e.g., guns, rifles, knives), all items previously/currently identified as safety concern.    Remove drugs/medications (over-the-counter, prescriptions, illicit drugs), all items previously/currently identified as a safety concern.  The family member/significant other verbalizes understanding of the suicide prevention education information provided.  The family member/significant other agrees to remove the items of safety concern listed above.  Daryel Geraldorth, Ausha Sieh B 06/15/2014, 10:39 AM

## 2014-06-15 NOTE — Progress Notes (Signed)
Patient ID: Kristina GuilesMarcia A Tarbet, female   DOB: [birth year], 38 y.o.   MRN: [id number]  Pt. 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 Patient reports she feels ready to go home today.  Belongings returned to patient at time of discharge. Patient denies any pain or discomfort. Discharge instructions and medications were reviewed with patient. Patient verbalized understanding of both medications and discharge instructions. Q15 minute safety checks maintained until discharge. No distress noted upon discharge.

## 2014-06-17 LAB — COMPREHENSIVE METABOLIC PANEL
ALBUMIN: 3.5 g/dL (ref 3.4–5.0)
AST: 30 U/L (ref 15–37)
Alkaline Phosphatase: 59 U/L
Anion Gap: 5 — ABNORMAL LOW (ref 7–16)
BILIRUBIN TOTAL: 0.5 mg/dL (ref 0.2–1.0)
BUN: 12 mg/dL (ref 7–18)
CALCIUM: 9 mg/dL (ref 8.5–10.1)
CO2: 27 mmol/L (ref 21–32)
Chloride: 107 mmol/L (ref 98–107)
Creatinine: 1.05 mg/dL (ref 0.60–1.30)
EGFR (African American): >60
EGFR (Non-African Amer.): >60
Glucose: 89 mg/dL (ref 65–99)
Osmolality: 277 (ref 275–301)
Potassium: 4.2 mmol/L (ref 3.5–5.1)
SGPT (ALT): 37 U/L
Sodium: 139 mmol/L (ref 136–145)
Total Protein: 7.1 g/dL (ref 6.4–8.2)

## 2014-06-17 LAB — DRUG SCREEN, URINE

## 2014-06-17 LAB — ETHANOL
Ethanol %: <0.003 % (ref 0.000–0.080)
Ethanol: <3 mg/dL

## 2014-06-17 LAB — CBC
HCT: 43.9 % (ref 35.0–47.0)
HGB: 14.5 g/dL (ref 12.0–16.0)
MCH: 32 pg (ref 26.0–34.0)
MCHC: 33.1 g/dL (ref 32.0–36.0)
MCV: 97 fL (ref 80–100)
Platelet: 219 10*3/uL (ref 150–440)
RBC: 4.55 10*6/uL (ref 3.80–5.20)
RDW: 12.5 % (ref 11.5–14.5)
WBC: 5.4 10*3/uL (ref 3.6–11.0)

## 2014-06-17 LAB — URINALYSIS, COMPLETE
BILIRUBIN, UR: NEGATIVE
Blood: NEGATIVE
Glucose,UR: NEGATIVE mg/dL (ref 0–75)
KETONE: NEGATIVE
Leukocyte Esterase: NEGATIVE
NITRITE: NEGATIVE
PH: 6 (ref 4.5–8.0)
PROTEIN: NEGATIVE
RBC, UR: NONE SEEN /HPF (ref 0–5)
SPECIFIC GRAVITY: 1.003 (ref 1.003–1.030)
Squamous Epithelial: 2
WBC UR: NONE SEEN /HPF (ref 0–5)

## 2014-06-17 LAB — SALICYLATE LEVEL: Salicylates, Serum: 2.7 mg/dL

## 2014-06-17 LAB — ACETAMINOPHEN LEVEL

## 2014-06-19 ENCOUNTER — Inpatient Hospital Stay: Payer: Self-pay | Admitting: Psychiatry

## 2014-06-20 NOTE — Progress Notes (Signed)
Patient Discharge Instructions:  After Visit Summary (AVS):   Faxed to:  06/20/14 Discharge Summary Note:   Faxed to:  06/20/14 Psychiatric Admission Assessment Note:   Faxed to:  06/20/14 Suicide Risk Assessment - Discharge Assessment:   Faxed to:  06/20/14 Faxed/Sent to the Next Level Care provider:  06/20/14 Faxed to Dr. Evelene CroonKaur @ [phone]  Jerelene ReddenSheena E Bowmore, 06/20/2014, 2:55 PM

## 2014-06-28 ENCOUNTER — Emergency Department: Payer: Self-pay | Admitting: Emergency Medicine

## 2014-06-28 LAB — URINALYSIS, COMPLETE
Bilirubin,UR: NEGATIVE
GLUCOSE, UR: NEGATIVE mg/dL (ref 0–75)
KETONE: NEGATIVE
Nitrite: NEGATIVE
PH: 6 (ref 4.5–8.0)
Protein: 100
RBC,UR: 422 /HPF (ref 0–5)
SPECIFIC GRAVITY: 1.017 (ref 1.003–1.030)
Squamous Epithelial: 4

## 2014-08-11 ENCOUNTER — Emergency Department: Payer: Self-pay | Admitting: Emergency Medicine

## 2014-08-11 LAB — URINALYSIS, COMPLETE
BILIRUBIN, UR: NEGATIVE
GLUCOSE, UR: NEGATIVE mg/dL (ref 0–75)
KETONE: NEGATIVE
NITRITE: NEGATIVE
PROTEIN: NEGATIVE
Ph: 5 (ref 4.5–8.0)
RBC,UR: 20 /HPF (ref 0–5)
Specific Gravity: 1.011 (ref 1.003–1.030)
Squamous Epithelial: 2

## 2014-08-14 LAB — URINE CULTURE

## 2014-08-27 ENCOUNTER — Emergency Department: Payer: Self-pay | Admitting: Emergency Medicine

## 2014-09-18 ENCOUNTER — Encounter (HOSPITAL_COMMUNITY): Payer: Self-pay | Admitting: *Deleted

## 2014-10-29 ENCOUNTER — Inpatient Hospital Stay (HOSPITAL_COMMUNITY)
Admission: AD | Admit: 2014-10-29 | Discharge: 2014-10-29 | Disposition: A | Discharge status: Home / Self Care | Payer: Medicaid Other | Source: Ambulatory Visit | Attending: Obstetrics and Gynecology | Admitting: Obstetrics and Gynecology

## 2014-10-29 ENCOUNTER — Encounter (HOSPITAL_COMMUNITY): Payer: Self-pay

## 2014-10-29 DIAGNOSIS — R102 Pelvic and perineal pain: Secondary | ICD-10-CM

## 2014-10-29 DIAGNOSIS — R109 Unspecified abdominal pain: Secondary | ICD-10-CM | POA: Diagnosis present

## 2014-10-29 DIAGNOSIS — F17200 Nicotine dependence, unspecified, uncomplicated: Secondary | ICD-10-CM | POA: Insufficient documentation

## 2014-10-29 DIAGNOSIS — R1031 Right lower quadrant pain: Secondary | ICD-10-CM | POA: Diagnosis not present

## 2014-10-29 DIAGNOSIS — Z90710 Acquired absence of both cervix and uterus: Secondary | ICD-10-CM | POA: Diagnosis not present

## 2014-10-29 LAB — URINALYSIS, ROUTINE W REFLEX MICROSCOPIC
Bilirubin Urine: NEGATIVE
Glucose, UA: NEGATIVE mg/dL
HGB URINE DIPSTICK: NEGATIVE
Ketones, ur: NEGATIVE mg/dL
Nitrite: NEGATIVE
PROTEIN: NEGATIVE mg/dL
Specific Gravity, Urine: 1.01 (ref 1.005–1.030)
Urobilinogen, UA: 0.2 mg/dL (ref 0.0–1.0)
pH: 5.5 (ref 5.0–8.0)

## 2014-10-29 LAB — CBC
HCT: 39.3 % (ref 36.0–46.0)
Hemoglobin: 13.7 g/dL (ref 12.0–15.0)
MCH: 32.5 pg (ref 26.0–34.0)
MCHC: 34.9 g/dL (ref 30.0–36.0)
MCV: 93.1 fL (ref 78.0–100.0)
Platelets: 241 10*3/uL (ref 150–400)
RBC: 4.22 MIL/uL (ref 3.87–5.11)
RDW: 12.8 % (ref 11.5–15.5)
WBC: 13.4 10*3/uL — ABNORMAL HIGH (ref 4.0–10.5)

## 2014-10-29 LAB — URINE MICROSCOPIC-ADD ON

## 2014-10-29 MED ORDER — HYDROCODONE-ACETAMINOPHEN 5-325 MG PO TABS: 1.0000 | ORAL_TABLET | Freq: Four times a day (QID) | ORAL | Status: DC | PRN

## 2014-10-29 NOTE — MAU Provider Note (Signed)
History     CSN: [id number]  Arrival date and time: 10/29/14 1816   First Provider Initiated Contact with Patient 10/29/14 1841      Chief Complaint  Patient presents with  . Abdominal Pain   HPI  [name] is a 38 y.o. [id number] s/p hysterectomy and left oophrectomy.  She presents with c/o RLQ pain since 4am. She had vomiting x1, no other GI changes. Nochange n discharge, odor or itching. No UTI S&S. Hx ovarian cysts every 6-8 m, will hav e pain for 3-5 daysm then intense ain and resolves. Usually treated with Vicodin or Percocet, Toradol not as effective.  OB History    Gravida Para Term Preterm AB TAB SAB Ectopic Multiple Living   2 2 1 1      2       Past Medical History  Diagnosis Date  . Endometriosis   . Migraines   . Ovarian cyst   . Anxiety   . Pregnancy induced hypertension     Past Surgical History  Procedure Laterality Date  . Laparoscopy      x5  . Abdominal hysterectomy      2009  . Cesarean section      1999  . Tubal ligation    . Left oophorectomy      Family History  Problem Relation Age of Onset  . Anesthesia problems Neg Hx   . Hypotension Neg Hx   . Malignant hyperthermia Neg Hx   . Pseudochol deficiency Neg Hx     History  Substance Use Topics  . Smoking status: Current Every Day Smoker -- 0.50 packs/day  . Smokeless tobacco: Not on file  . Alcohol Use: Yes    Allergies:  Allergies  Allergen Reactions  . Antihistamines, Diphenhydramine-Type Other (See Comments)    Heart palpitations & racing  . Penicillins Rash  . Sulfa Antibiotics Rash    Prescriptions prior to admission  Medication Sig Dispense Refill Last Dose  . busPIRone (BUSPAR) 10 MG tablet Take 1 tablet (10 mg total) by mouth 2 (two) times daily. 60 tablet 0   . escitalopram (LEXAPRO) 10 MG tablet Take 1 tablet (10 mg total) by mouth daily. 30 tablet 0   . risperiDONE (RISPERDAL M-TABS) 2 MG disintegrating tablet Take 1 tablet (2 mg total) by mouth at bedtime. 30  tablet 0     Review of Systems  Constitutional: Negative for fever and chills.  Gastrointestinal: Positive for vomiting and abdominal pain. Negative for nausea, diarrhea and constipation.  Genitourinary: Negative for dysuria, urgency and frequency.   Physical Exam   Blood pressure 122/72, pulse 82, temperature 98 F (36.7 C), temperature source Oral, resp. rate 18.  Physical Exam  Constitutional: She is oriented to person, place, and time. She appears well-developed and well-nourished.  GI: Soft. There is tenderness. There is no rebound and no guarding.  Genitourinary:  Pelvic exam-  Ext gen- nl anatomy, skin intact Vagina- scant thin white discharge Cx- absent Uterus-absent  Adn-left non tender, no mass palp. Right slighty tender, no mass palp  Musculoskeletal: Normal range of motion.  Neurological: She is alert and oriented to person, place, and time.  Skin: Skin is warm and dry.  Psychiatric: She has a normal mood and affect. Her behavior is normal.    MAU Course  Procedures  MDM Results for orders placed or performed during the hospital encounter of 10/29/14 (from the past 24 hour(s))  Urinalysis, Routine w reflex microscopic  Status: Abnormal   Collection Time: 10/29/14  6:21 PM  Result Value Ref Range   Color, Urine YELLOW YELLOW   APPearance CLEAR CLEAR   Specific Gravity, Urine 1.010 1.005 - 1.030   pH 5.5 5.0 - 8.0   Glucose, UA NEGATIVE NEGATIVE mg/dL   Hgb urine dipstick NEGATIVE NEGATIVE   Bilirubin Urine NEGATIVE NEGATIVE   Ketones, ur NEGATIVE NEGATIVE mg/dL   Protein, ur NEGATIVE NEGATIVE mg/dL   Urobilinogen, UA 0.2 0.0 - 1.0 mg/dL   Nitrite NEGATIVE NEGATIVE   Leukocytes, UA TRACE (A) NEGATIVE  Urine microscopic-add on     Status: None   Collection Time: 10/29/14  6:21 PM  Result Value Ref Range   WBC, UA 0-2 <3 WBC/hpf   RBC / HPF 0-2 <3 RBC/hpf  CBC     Status: Abnormal   Collection Time: 10/29/14  6:55 PM  Result Value Ref Range   WBC  13.4 (H) 4.0 - 10.5 K/uL   RBC 4.22 3.87 - 5.11 MIL/uL   Hemoglobin 13.7 12.0 - 15.0 g/dL   HCT 08.639.3 57.836.0 - 46.946.0 %   MCV 93.1 78.0 - 100.0 fL   MCH 32.5 26.0 - 34.0 pg   MCHC 34.9 30.0 - 36.0 g/dL   RDW 62.912.8 52.811.5 - 41.315.5 %   Platelets 241 150 - 400 K/uL     Assessment and Plan  RLQ pain, hx frequent ovarian cysts Tx Vicodin Precautions reviewed- call the office with continued N&V, fever, decreased appetite Consulted with Dr Thornton PapasGrewal  Lanorris Kalisz M. 10/29/2014, 6:54 PM

## 2014-10-29 NOTE — MAU Note (Signed)
Pt presents complaining of lower abdominal pain on her right side that radiates to her back. Pt states it is from her ovary and this happens every 6 months and she always comes to MAU for it. Denies vaginal bleeding or discharge.

## 2015-01-31 ENCOUNTER — Emergency Department: Payer: Self-pay | Admitting: Emergency Medicine

## 2015-02-07 ENCOUNTER — Emergency Department: Payer: Self-pay | Admitting: Emergency Medicine

## 2015-02-12 ENCOUNTER — Emergency Department: Payer: Self-pay | Admitting: Emergency Medicine

## 2015-02-12 LAB — CBC
HCT: 39 % (ref 35.0–47.0)
HGB: 13.4 g/dL (ref 12.0–16.0)
MCH: 32 pg (ref 26.0–34.0)
MCHC: 34.3 g/dL (ref 32.0–36.0)
MCV: 93 fL (ref 80–100)
PLATELETS: 311 10*3/uL (ref 150–440)
RBC: 4.19 10*6/uL (ref 3.80–5.20)
RDW: 13.3 % (ref 11.5–14.5)
WBC: 13.2 10*3/uL — ABNORMAL HIGH (ref 3.6–11.0)

## 2015-02-13 LAB — COMPREHENSIVE METABOLIC PANEL
ALBUMIN: 4.1 g/dL
AST: 19 U/L
Alkaline Phosphatase: 50 U/L
Anion Gap: 8 (ref 7–16)
BUN: 31 mg/dL — AB
Bilirubin,Total: 0.7 mg/dL
CO2: 28 mmol/L
Calcium, Total: 9.7 mg/dL
Chloride: 100 mmol/L — ABNORMAL LOW
Creatinine: 1.05 mg/dL — ABNORMAL HIGH
EGFR (Non-African Amer.): >60
GLUCOSE: 80 mg/dL
Potassium: 4.6 mmol/L
SGPT (ALT): 19 U/L
SODIUM: 136 mmol/L
Total Protein: 7.3 g/dL

## 2015-03-10 NOTE — Discharge Summary (Signed)
PATIENT NAME:  [name], HECHAVARRIA MR#:  [id number] DATE OF BIRTH:  1976/07/09  DATE OF ADMISSION:  06/19/2014 DATE OF DISCHARGE:  06/23/2014  REASON FOR ADMISSION: The patient was petitioned by her mother for psychiatric evaluation after taking her clothes off in front of strangers.   DISCHARGE DIAGNOSES:  AXIS I: Major depressive disorder, recurrent.    Panic disorder.   Benzodiazepine withdrawal, induced delirium. AXIS II: Fair. AXIS III: None. AXIS IV: Children with addiction, behavioral and legal problems.   AXIS V: GAF of 60.  DISCHARGE MEDICATIONS: Sertraline 25 mg p.o. q. daily for anxiety and depression, clonazepam 0.5 mg every 8 hours for anxiety, trazodone 150 mg p.o. q. bedtime for insomnia.   MEDICATIONS THAT WERE DISCONTINUED: Risperdal, BuSpar and Lexapro.   HOSPITAL COURSE: The patient was admitted to behavioral health for further evaluation and treatment.  Per collateral information, the patient had been discharged just a few days prior from [facility] in [place] for a similar episode.  The patient explained it that she has been under psychiatric care with Dr. Ihor Austin for depression and anxiety.  For many years, she has been taking Lexapro and Ativan 2 mg twice a day.  The patient receives her medications through mail order, however, she states that something went wrong with the mail order and she failed to receive her medications.  For two weeks she was without any Ativan or Lexapro.  The patient went to see Dr. Ihor Austin, who gave her a prescription for Seroquel.  The patient stated she had an adverse reaction to it and ended up being hospitalized at [facility] in [place].  Per the records, the patient was there and had unusual, bizarre behavior.  Their working diagnoses were benzodiazepine withdrawal delirium versus a brief psychotic episode.  The patient was discharged on Lexapro, BuSpar and Risperdal 2 mg at bedtime.  The patient, after discharge, decompensated quickly and  started taking her clothes off in front of strangers.  Her mother then petitioned her for psychiatric treatment and she was admitted to our unit.  Based on her history and the fact that the patient does not suffer from any psychotic disorder and does not have any family history of psychosis, it was felt that the patient was more likely experiencing withdrawal delirium from Ativan.  The patient was started on clonazepam 0.5 mg 3 times a day along with Sertraline 25 mg and trazodone 150 mg.  Soon after the patient received the first few doses of clonazepam, her symptoms were significantly improved.  The patient's affect, energy, ability to communicate, concentration and sleep were much improved.  The patient was maintained on that regimen throughout her hospitalization without any complications or side effects.  At the time of discharge, the patient felt much improved.  She denies any suicidality, homicidality or psychosis or any side effects from her medication.   MENTAL STATUS EXAMINATION: At the time of discharge, the patient was alert.  She was oriented to person, place, time and situation.  Psychomotor activity was within normal range.  Speech had regular tone, volume and rate. Thought process linear.  Thought content was negative for suicidality, homicidality.  Perception was negative for psychosis.  Mood euthymic.  Affect reactive. Insight and judgment good.     DISCHARGE DISPOSITION: The patient will be discharged back to her home.    DISCHARGE FOLLOWUP: The patient will continue to followup with Dr. Ihor Austin, her outpatient psychiatrist, in [place].    Of note, the patient received a 7  day supply free of her medications and also prescriptions for 30 days of her discharge medications.     ____________________________ Jimmy FootmanAndrea Hernandez-Gonzalez, MD ahg:jh D: 06/23/2014 14:46:04 ET T: 06/24/2014 01:22:33 ET JOB#: [id number]  cc: Jimmy FootmanAndrea Hernandez-Gonzalez, MD, <Dictator> Horton ChinANDREA HERNANDEZ GONZAL  MD ELECTRONICALLY SIGNED 06/26/2014 13:03

## 2015-03-10 NOTE — Consult Note (Signed)
PATIENT NAME:  [name], [name] MR#:  [id number] DATE OF BIRTH:  04-Dec-1975  DATE OF CONSULTATION:  06/18/2014  CONSULTING PHYSICIAN:  Shakeia Krus K. Bergen Magner, MD  SEX: Female.  RACE: White.  SUBJECTIVE: The patient was seen in [facility] at [facility]. The patient is Cooper 39 year old white female divorced twice and has 2 children that live with her. 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 The patient reports that she was let go from her job after 5 years because she was not able to keep up and acting right. The patient reports that she was stabilized on Ativan 2 mg p.o. 4 times Cooper day [place]. However, she does not take it 4 times Cooper day and takes it only on Cooper p.r.n. basis. The patient reports that she gets mail order medication and she ran out of it and she felt Cooper lot of anxiety and could not function and so they let her go. She lost her insurance because of the same. The patient reports that she has been to [facility] where she was under observation for 72 hours and they started her on Ativan 2 mg 3 times Cooper day and Risperidone 1 tablet at bedtime. Does not remember the dosage. They gave her Cooper followup appointment with Dr. Lafayette Dragonarr and she kept it up 2 days ago. However, according to information obtained from the staff, her mother was concerned because of bizarre behavior of the patient. The patient went to Cooper gas station and did not have money to pay and had arguments and then she went to her boyfriend's house where she took off her clothes and was running naked in the streets and this bothered them Cooper great deal and brought her here for help.  PAST PSYCHIATRIC HISTORY: No previous history of inpatient hold on psychiatry. No history of suicide attempts.  ALCOHOL AND DRUGS: Denies drinking alcohol, denies street or prescription drug abuse. Smokes nicotine cigarettes at Cooper rate of half pack Cooper day for many years.  MENTAL  STATUS EXAMINATION: Patient dressed in hospital scrubs. Alert and oriented to place, person, and time. Appears very anxious and stressed out. Denies feeling depressed but she does appear stressed out. Denies feeling hopeless or helpless. Denies any auditory or visual hallucinations. When she was confronted and informed about the situation, she was not aware of anything. Insight and judgement guarded. Impulse control is poor.   IMPRESSION: Acute stress disorder.  RECOMMENDATION: Inpatient hospital hospitalization in psychiatry for close observation and evaluation. The patient was reassured that her condition will be evaluated and her medications stabilized and then she will be discharged.   ____________________________ Jannet MantisSurya K. Guss Bundehalla, MD skc:lt D: 06/18/2014 18:29:58 ET T: 06/18/2014 20:37:42 ET JOB#: [id number]  cc: Monika SalkSurya K. Guss Bundehalla, MD, <Dictator> Beau FannySURYA K Chijioke Lasser MD ELECTRONICALLY SIGNED 06/19/2014 9:28

## 2015-11-07 ENCOUNTER — Encounter: Payer: Self-pay | Admitting: *Deleted

## 2015-11-07 ENCOUNTER — Emergency Department
Admission: EM | Admit: 2015-11-07 | Discharge: 2015-11-07 | Disposition: A | Discharge status: Home / Self Care | Payer: Medicaid Other | Attending: Emergency Medicine | Admitting: Emergency Medicine

## 2015-11-07 DIAGNOSIS — F17200 Nicotine dependence, unspecified, uncomplicated: Secondary | ICD-10-CM | POA: Diagnosis not present

## 2015-11-07 DIAGNOSIS — Z79899 Other long term (current) drug therapy: Secondary | ICD-10-CM | POA: Insufficient documentation

## 2015-11-07 DIAGNOSIS — J0100 Acute maxillary sinusitis, unspecified: Secondary | ICD-10-CM | POA: Insufficient documentation

## 2015-11-07 DIAGNOSIS — R51 Headache: Secondary | ICD-10-CM | POA: Diagnosis present

## 2015-11-07 DIAGNOSIS — Z880 Allergy status to penicillin: Secondary | ICD-10-CM | POA: Insufficient documentation

## 2015-11-07 MED ORDER — GUAIFENESIN-CODEINE 100-10 MG/5ML PO SOLN: 10.0000 mL | Freq: Three times a day (TID) | ORAL | Status: DC | PRN

## 2015-11-07 MED ORDER — PREDNISONE 10 MG (21) PO TBPK: ORAL_TABLET | ORAL | Status: DC

## 2015-11-07 NOTE — ED Provider Notes (Signed)
[facility] Emergency Department Provider Note ____________________________________________  Time seen: Approximately 11:04 AM  I have reviewed the triage vital signs and the nursing notes.   HISTORY  Chief Complaint URI   HPI Kristina GuilesMarcia A Cooper is a 39 y.o. female who presents to the emergency department for evaluation of sinus pain and pressure. She states that she awakened this morning with nasal congestion, sinus pain and pressure, and a headache. She denies fever.   Past Medical History  Diagnosis Date  . Endometriosis   . Migraines   . Ovarian cyst   . Anxiety   . Pregnancy induced hypertension     Patient Active Problem List   Diagnosis Date Noted  . 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 Psychotic disorder 06/10/2014    Past Surgical History  Procedure Laterality Date  . Laparoscopy      x5  . Abdominal hysterectomy      2009  . Cesarean section      1999  . Tubal ligation    . Left oophorectomy      Current Outpatient Rx  Name  Route  Sig  Dispense  Refill  . ALPRAZolam (XANAX) 1 MG tablet   Oral   Take 1 mg by mouth 2 (two) times daily.         Marland Kitchen. acetaminophen (TYLENOL) 500 MG tablet   Oral   Take 1,000 mg by mouth every 6 (six) hours as needed for mild pain.         . busPIRone (BUSPAR) 10 MG tablet   Oral   Take 1 tablet (10 mg total) by mouth 2 (two) times daily. Patient not taking: Reported on 10/29/2014   60 tablet   0   . clonazePAM (KLONOPIN) 0.5 MG tablet   Oral   Take 0.5 mg by mouth 2 (two) times daily.       2   . escitalopram (LEXAPRO) 10 MG tablet   Oral   Take 1 tablet (10 mg total) by mouth daily. Patient not taking: Reported on 10/29/2014   30 tablet   0   . guaiFENesin-codeine 100-10 MG/5ML syrup   Oral   Take 10 mLs by mouth 3 (three) times daily as needed.   120 mL   0   . HYDROcodone-acetaminophen (NORCO/VICODIN) 5-325 MG per tablet   Oral  Take 1 tablet by mouth every 6 (six) hours as needed.   10 tablet   0   . predniSONE (STERAPRED UNI-PAK 21 TAB) 10 MG (21) TBPK tablet      Take 6 tablets on day 1 Take 5 tablets on day 2 Take 4 tablets on day 3 Take 3 tablets on day 4 Take 2 tablets on day 5 Take 1 tablet on day 6   21 tablet   0   . risperiDONE (RISPERDAL M-TABS) 2 MG disintegrating tablet   Oral   Take 1 tablet (2 mg total) by mouth at bedtime. Patient not taking: Reported on 10/29/2014   30 tablet   0   . sertraline (ZOLOFT) 100 MG tablet   Oral   Take 100 mg by mouth daily.      5     Allergies Antihistamines, diphenhydramine-type; Penicillins; and Sulfa antibiotics  Family History  Problem Relation Age of Onset  . Anesthesia problems Neg Hx   . Hypotension Neg Hx   . Malignant hyperthermia Neg Hx   . Pseudochol deficiency Neg Hx  Social History Social History  Substance Use Topics  . Smoking status: Current Every Day Smoker -- 0.50 packs/day  . Smokeless tobacco: None  . Alcohol Use: No    Review of Systems Constitutional: No fever/chills Eyes: No visual changes. ENT: No sore throat. Cardiovascular: Denies chest pain. Respiratory: Negative for shortness of breath. Negative for cough. Gastrointestinal: Negative for abdominal pain. Negative for nausea,  no vomiting.  No diarrhea.  Genitourinary: Negative for dysuria. Musculoskeletal: Negative for body aches Skin: Negative for rash. Neurological: Positive for headaches, Negative for focal weakness or numbness.  10-point ROS otherwise negative.  ____________________________________________   PHYSICAL EXAM:  VITAL SIGNS: ED Triage Vitals  Enc Vitals Group     BP 11/07/15 1010 120/78 mmHg     Pulse Rate 11/07/15 1010 100     Resp 11/07/15 1010 18     Temp 11/07/15 1010 98.1 F (36.7 C)     Temp Source 11/07/15 1010 Oral     SpO2 11/07/15 1010 95 %     Weight 11/07/15 1010 110 lb (49.896 kg)     Height 11/07/15 1010 4'  11" (1.499 m)     Head Cir --      Peak Flow --      Pain Score --      Pain Loc --      Pain Edu? --      Excl. in GC? --     Constitutional: Alert and oriented. Well appearing and in no acute distress. Eyes: Conjunctivae are normal. PERRL. EOMI. Ears: Bilateral tympanic membranes are normal. Head: Atraumatic. Nose: Nasal mucosa is boggy and erythematous with clear rhinorrhea Mouth/Throat: Mucous membranes are moist.  Oropharynx non-erythematous. Neck: No stridor.  Lymphatic: No cervical lymphadenopathy. Cardiovascular: Normal rate, regular rhythm. Grossly normal heart sounds.  Good peripheral circulation. Respiratory: Normal respiratory effort.  No retractions. Lungs clear to auscultation throughout. Gastrointestinal: Soft and nontender. No distention. No abdominal bruits. No CVA tenderness. Musculoskeletal: No joint pain reported. Neurologic:  Normal speech and language. No gross focal neurologic deficits are appreciated. Speech is normal. No gait instability. Skin:  Skin is warm, dry and intact. No rash noted. Psychiatric: Mood and affect are normal. Speech and behavior are normal.  ____________________________________________   LABS (all labs ordered are listed, but only abnormal results are displayed)  Labs Reviewed - No data to display ____________________________________________  EKG   ____________________________________________  RADIOLOGY  Not indicated ____________________________________________   PROCEDURES  Procedure(s) performed: None  Critical Care performed: No  ____________________________________________   INITIAL IMPRESSION / ASSESSMENT AND PLAN / ED COURSE  Pertinent labs & imaging results that were available during my care of the patient were reviewed by me and considered in my medical decision making (see chart for details).  Patient was advised to follow up with the primary care provider for symptoms that are not improving over the  next 2-3 days. She was advised to return to the emergency department for symptoms that change or worsen if unable to schedule an appointment with the primary care provider. ____________________________________________   FINAL CLINICAL IMPRESSION(S) / ED DIAGNOSES  Final diagnoses:  Acute maxillary sinusitis, recurrence not specified       Chinita Pester, FNP 11/07/15 1209  Emily Filbert, MD 11/07/15 [id number]

## 2015-11-07 NOTE — Discharge Instructions (Signed)

## 2015-11-07 NOTE — ED Notes (Signed)
Woke up this am with sinus pressure and congestion  No fever   States positive headache

## 2015-11-07 NOTE — ED Notes (Signed)
Pt reports cough and congestion today, pt denies fever

## 2015-11-08 ENCOUNTER — Emergency Department
Admission: EM | Admit: 2015-11-08 | Discharge: 2015-11-08 | Disposition: A | Discharge status: Home / Self Care | Payer: Medicaid Other | Attending: Emergency Medicine | Admitting: Emergency Medicine

## 2015-11-08 DIAGNOSIS — J0100 Acute maxillary sinusitis, unspecified: Secondary | ICD-10-CM | POA: Diagnosis not present

## 2015-11-08 DIAGNOSIS — R0981 Nasal congestion: Secondary | ICD-10-CM | POA: Diagnosis present

## 2015-11-08 DIAGNOSIS — J069 Acute upper respiratory infection, unspecified: Secondary | ICD-10-CM | POA: Insufficient documentation

## 2015-11-08 DIAGNOSIS — Z880 Allergy status to penicillin: Secondary | ICD-10-CM | POA: Diagnosis not present

## 2015-11-08 DIAGNOSIS — Z79899 Other long term (current) drug therapy: Secondary | ICD-10-CM | POA: Diagnosis not present

## 2015-11-08 DIAGNOSIS — F17200 Nicotine dependence, unspecified, uncomplicated: Secondary | ICD-10-CM | POA: Insufficient documentation

## 2015-11-08 MED ORDER — BENZONATATE 100 MG PO CAPS: 100.0000 mg | ORAL_CAPSULE | Freq: Three times a day (TID) | ORAL | Status: DC | PRN

## 2015-11-08 MED ORDER — FLUTICASONE PROPIONATE 50 MCG/ACT NA SUSP: 1.0000 | Freq: Every day | NASAL | Status: DC

## 2015-11-08 NOTE — Discharge Instructions (Signed)
Cough, Adult A cough helps to clear your throat and lungs. A cough may last only 2-3 weeks (acute), or it may last longer than 8 weeks (chronic). Many different things can cause a cough. A cough may be a sign of an illness or another medical condition. HOME CARE  Pay attention to any changes in your cough.  Take medicines only as told by your doctor.  If you were prescribed an antibiotic medicine, take it as told by your doctor. Do not stop taking it even if you start to feel better.  Talk with your doctor before you try using a cough medicine.  Drink enough fluid to keep your pee (urine) clear or pale yellow.  If the air is dry, use a cold steam vaporizer or humidifier in your home.  Stay away from things that make you cough at work or at home.  If your cough is worse at night, try using extra pillows to raise your head up higher while you sleep.  Do not smoke, and try not to be around smoke. If you need help quitting, ask your doctor.  Do not have caffeine.  Do not drink alcohol.  Rest as needed. GET HELP IF:  You have new problems (symptoms).  You cough up yellow fluid (pus).  Your cough does not get better after 2-3 weeks, or your cough gets worse.  Medicine does not help your cough and you are not sleeping well.  You have pain that gets worse or pain that is not helped with medicine.  You have a fever.  You are losing weight and you do not know why.  You have night sweats. GET HELP RIGHT AWAY IF:  You cough up blood.  You have trouble breathing.  Your heartbeat is very fast.   This information is not intended to replace advice given to you by your health care provider. Make sure you discuss any questions you have with your health care provider.   Document Released: 07/17/2011 Document Revised: 07/25/2015 Document Reviewed: 01/10/2015 Elsevier Interactive Patient Education 2016 [employer]  [employer] Energy[employer] Mist Vaporizers Vaporizers may help relieve the symptoms  of a cough and cold. They add moisture to the air, which helps mucus to become thinner and less sticky. This makes it easier to breathe and cough up secretions. Cool mist vaporizers do not cause serious burns like hot mist vaporizers, which may also be called steamers or humidifiers. Vaporizers have not been proven to help with colds. You should not use a vaporizer if you are allergic to mold. HOME CARE INSTRUCTIONS  Follow the package instructions for the vaporizer.  Do not use anything other than distilled water in the vaporizer.  Do not run the vaporizer all of the time. This can cause mold or bacteria to grow in the vaporizer.  Clean the vaporizer after each time it is used.  Clean and dry the vaporizer well before storing it.  Stop using the vaporizer if worsening respiratory symptoms develop.   This information is not intended to replace advice given to you by your health care provider. Make sure you discuss any questions you have with your health care provider.   Document Released: 07/31/2004 Document Revised: 11/08/2013 Document Reviewed: 03/23/2013 Elsevier Interactive Patient Education 2016 [employer]  Sinusitis, Adult Sinusitis is redness, soreness, and puffiness (inflammation) of the air pockets in the bones of your face (sinuses). The redness, soreness, and puffiness can cause air and mucus to get trapped in your sinuses. This can allow germs  to grow and cause an infection.  HOME CARE   Drink enough fluids to keep your pee (urine) clear or pale yellow.  Use a humidifier in your home.  Run a hot shower to create steam in the bathroom. Sit in the bathroom with the door closed. Breathe in the steam 3-4 times a day.  Put a warm, moist washcloth on your face 3-4 times a day, or as told by your doctor.  Use salt water sprays (saline sprays) to wet the thick fluid in your nose. This can help the sinuses drain.  Only take medicine as told by your doctor. GET HELP RIGHT  AWAY IF:   Your pain gets worse.  You have very bad headaches.  You are sick to your stomach (nauseous).  You throw up (vomit).  You are very sleepy (drowsy) all the time.  Your face is puffy (swollen).  Your vision changes.  You have a stiff neck.  You have trouble breathing. MAKE SURE YOU:   Understand these instructions.  Will watch your condition.  Will get help right away if you are not doing well or get worse.   This information is not intended to replace advice given to you by your health care provider. Make sure you discuss any questions you have with your health care provider.   Document Released: 04/21/2008 Document Revised: 11/24/2014 Document Reviewed: 06/08/2012 Elsevier Interactive Patient Education 2016 [employer].  Take the prescription meds as directed. Follow-up with your provider as needed. Start OTC Zyrtec as discussed.

## 2015-11-08 NOTE — ED Provider Notes (Signed)
[facility] Emergency Department Provider Note ____________________________________________  Time seen: 1130  I have reviewed the triage vital signs and the nursing notes.  HISTORY  Chief Complaint  Nasal Congestion and Sore Throat  HPI [name] is a 39 y.o. female returns to the ED one day following onset of symptoms and evaluation for sinusitis. She was discharged with a prescription for prednisone as well as codeine-guaifenesin cough syrup. She describes she awoke this morning with some increased nasal drainage and continued sinus pressure, although she notes it is somewhat improved. She denies any intermittent fever, chills, or sweats. She also noted some scant blood to the nasal mucus this morning.  Past Medical History  Diagnosis Date  . Endometriosis   . Migraines   . Ovarian cyst   . Anxiety   . Pregnancy induced hypertension     Patient Active Problem List   Diagnosis Date Noted  . 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 Psychotic disorder 06/10/2014    Past Surgical History  Procedure Laterality Date  . Laparoscopy      x5  . Abdominal hysterectomy      2009  . Cesarean section      1999  . Tubal ligation    . Left oophorectomy      Current Outpatient Rx  Name  Route  Sig  Dispense  Refill  . acetaminophen (TYLENOL) 500 MG tablet   Oral   Take 1,000 mg by mouth every 6 (six) hours as needed for mild pain.         Marland Kitchen. ALPRAZolam (XANAX) 1 MG tablet   Oral   Take 1 mg by mouth 2 (two) times daily.         . benzonatate (TESSALON PERLES) 100 MG capsule   Oral   Take 1 capsule (100 mg total) by mouth 3 (three) times daily as needed for cough (Take 1-2 per dose).   30 capsule   0   . busPIRone (BUSPAR) 10 MG tablet   Oral   Take 1 tablet (10 mg total) by mouth 2 (two) times daily. Patient not taking: Reported on 10/29/2014   60 tablet   0   . clonazePAM (KLONOPIN) 0.5 MG tablet   Oral   Take 0.5 mg by mouth 2 (two) times daily.       2   . escitalopram (LEXAPRO) 10 MG tablet   Oral   Take 1 tablet (10 mg total) by mouth daily. Patient not taking: Reported on 10/29/2014   30 tablet   0   . fluticasone (FLONASE) 50 MCG/ACT nasal spray   Each Nare   Place 1 spray into both nostrils daily.   16 g   0   . guaiFENesin-codeine 100-10 MG/5ML syrup   Oral   Take 10 mLs by mouth 3 (three) times daily as needed.   120 mL   0   . HYDROcodone-acetaminophen (NORCO/VICODIN) 5-325 MG per tablet   Oral   Take 1 tablet by mouth every 6 (six) hours as needed.   10 tablet   0   . predniSONE (STERAPRED UNI-PAK 21 TAB) 10 MG (21) TBPK tablet      Take 6 tablets on day 1 Take 5 tablets on day 2 Take 4 tablets on day 3 Take 3 tablets on day 4 Take 2 tablets on day 5 Take 1 tablet on day 6   21 tablet   0   . risperiDONE (  RISPERDAL M-TABS) 2 MG disintegrating tablet   Oral   Take 1 tablet (2 mg total) by mouth at bedtime. Patient not taking: Reported on 10/29/2014   30 tablet   0   . sertraline (ZOLOFT) 100 MG tablet   Oral   Take 100 mg by mouth daily.      5    Allergies Antihistamines, diphenhydramine-type; Penicillins; and Sulfa antibiotics  Family History  Problem Relation Age of Onset  . Anesthesia problems Neg Hx   . Hypotension Neg Hx   . Malignant hyperthermia Neg Hx   . Pseudochol deficiency Neg Hx    Social History Social History  Substance Use Topics  . Smoking status: Current Every Day Smoker -- 0.50 packs/day  . Smokeless tobacco: None  . Alcohol Use: No   Review of Systems  Constitutional: Negative for fever. Eyes: Negative for visual changes. ENT: Negative for sore throat. Cardiovascular: Negative for chest pain. Respiratory: Negative for shortness of breath. Gastrointestinal: Negative for abdominal pain, vomiting and diarrhea. Genitourinary: Negative for dysuria. Musculoskeletal: Negative for back pain. Skin: Negative  for rash. Neurological: Negative for headaches, focal weakness or numbness. ____________________________________________  PHYSICAL EXAM:  VITAL SIGNS: ED Triage Vitals  Enc Vitals Group     BP 11/08/15 1045 118/80 mmHg     Pulse Rate 11/08/15 1045 86     Resp 11/08/15 1045 18     Temp 11/08/15 1045 98.8 F (37.1 C)     Temp Source 11/08/15 1045 Oral     SpO2 11/08/15 1045 98 %     Weight 11/08/15 1045 110 lb (49.896 kg)     Height 11/08/15 1045  (1.499 m)     Head Cir --      Peak Flow --      Pain Score 11/08/15 1046 6     Pain Loc --      Pain Edu? --      Excl. in GC? --    Constitutional: Alert and oriented. Well appearing and in no distress. Head: Normocephalic and atraumatic.      Eyes: Conjunctivae are normal. PERRL. Normal extraocular movements      Ears: Canals clear. TMs intact bilaterally.   Nose: No rhinorrhea. Dried blood scab in anterior right nare. No active bleeding. Turbinates enlarged.    Mouth/Throat: Mucous membranes are moist. Erythematous oropharynx, tonsils flat, uvula midline.    Neck: Supple. No thyromegaly. Hematological/Lymphatic/Immunological: No cervical lymphadenopathy. Cardiovascular: Normal rate, regular rhythm.  Respiratory: Normal respiratory effort. No wheezes/rales/rhonchi. Gastrointestinal: Soft and nontender. No distention. Musculoskeletal: Nontender with normal range of motion in all extremities.  Neurologic:  Normal gait without ataxia. Normal speech and language. No gross focal neurologic deficits are appreciated. Skin:  Skin is warm, dry and intact. No rash noted. Psychiatric: Mood and affect are normal. Patient exhibits appropriate insight and judgment. ____________________________________________  INITIAL IMPRESSION / ASSESSMENT AND PLAN / ED COURSE  Acute sinusitis and URI without signs of acute infectious process. Patient will continue previously prescribed medications as directed. She will be sent home with  additional prescription for Flonase, Tessalon Perles, and encouraged to dose over-the-counter second generation antihistamine. She will follow-up with her primary care provider for ongoing symptoms. A work note is provided for today as requested. ____________________________________________  FINAL CLINICAL IMPRESSION(S) / ED DIAGNOSES  Final diagnoses:  Acute URI  Acute maxillary sinusitis, recurrence not specified       Lissa Hoard, PA-C 11/08/15 1218  Darien Ramus, MD 11/08/15  1548 

## 2015-11-08 NOTE — ED Notes (Signed)
Pt dx with sinusitis. Does not feel any better after taking 1 day of prednisone

## 2016-03-12 ENCOUNTER — Emergency Department: Payer: Medicaid Other

## 2016-03-12 DIAGNOSIS — S63610A Unspecified sprain of right index finger, initial encounter: Secondary | ICD-10-CM | POA: Insufficient documentation

## 2016-03-12 DIAGNOSIS — Y92009 Unspecified place in unspecified non-institutional (private) residence as the place of occurrence of the external cause: Secondary | ICD-10-CM | POA: Insufficient documentation

## 2016-03-12 DIAGNOSIS — Z7952 Long term (current) use of systemic steroids: Secondary | ICD-10-CM | POA: Insufficient documentation

## 2016-03-12 DIAGNOSIS — F17200 Nicotine dependence, unspecified, uncomplicated: Secondary | ICD-10-CM | POA: Insufficient documentation

## 2016-03-12 DIAGNOSIS — Z79899 Other long term (current) drug therapy: Secondary | ICD-10-CM | POA: Insufficient documentation

## 2016-03-12 DIAGNOSIS — Y999 Unspecified external cause status: Secondary | ICD-10-CM | POA: Insufficient documentation

## 2016-03-12 DIAGNOSIS — W2209XA Striking against other stationary object, initial encounter: Secondary | ICD-10-CM | POA: Insufficient documentation

## 2016-03-12 DIAGNOSIS — Y939 Activity, unspecified: Secondary | ICD-10-CM | POA: Insufficient documentation

## 2016-03-12 NOTE — ED Notes (Signed)
Patient ambulatory to triage with steady gait, without difficulty or distress noted; pt reports right index finger pain after hitting it on countertop

## 2016-03-13 ENCOUNTER — Emergency Department
Admission: EM | Admit: 2016-03-13 | Discharge: 2016-03-13 | Disposition: A | Discharge status: Home / Self Care | Payer: Medicaid Other | Attending: Emergency Medicine | Admitting: Emergency Medicine

## 2016-03-13 DIAGNOSIS — S63619A Unspecified sprain of unspecified finger, initial encounter: Secondary | ICD-10-CM

## 2016-03-13 NOTE — ED Provider Notes (Signed)
[facility] Emergency Department Provider Note    ____________________________________________  Time seen: 12:40 AM  I have reviewed the triage vital signs and the nursing notes.   HISTORY  Chief Complaint Right index finger injury and pain      HPI [name] is a 40 y.o. female who presents with pain in her right index finger.  It was acute in onset when she hit it on the dishwasher when loading some dishes at home.  She reports some swelling, severe tenderness with any movement or touching of the finger, some mild tingling in the proximal aspect of the finger, and pain radiating into her hand.  Her ability to flex the finger is limited by pain.  She did not sustain any other injuries.  Holding still makes it a little bit better and movement makes it worse.     Past Medical History  Diagnosis Date  . Endometriosis   . Migraines   . Ovarian cyst   . Anxiety   . Pregnancy induced hypertension     Patient Active Problem List   Diagnosis Date Noted  . 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 Psychotic disorder 06/10/2014    Past Surgical History  Procedure Laterality Date  . Laparoscopy      x5  . Abdominal hysterectomy      2009  . Cesarean section      1999  . Tubal ligation    . Left oophorectomy      Current Outpatient Rx  Name  Route  Sig  Dispense  Refill  . acetaminophen (TYLENOL) 500 MG tablet   Oral   Take 1,000 mg by mouth every 6 (six) hours as needed for mild pain.         Marland Kitchen. ALPRAZolam (XANAX) 1 MG tablet   Oral   Take 1 mg by mouth 2 (two) times daily.         . benzonatate (TESSALON PERLES) 100 MG capsule   Oral   Take 1 capsule (100 mg total) by mouth 3 (three) times daily as needed for cough (Take 1-2 per dose).   30 capsule   0   . busPIRone (BUSPAR) 10 MG tablet   Oral   Take 1 tablet (10 mg total) by mouth 2 (two) times daily. Patient not taking: Reported on  10/29/2014   60 tablet   0   . clonazePAM (KLONOPIN) 0.5 MG tablet   Oral   Take 0.5 mg by mouth 2 (two) times daily.       2   . escitalopram (LEXAPRO) 10 MG tablet   Oral   Take 1 tablet (10 mg total) by mouth daily. Patient not taking: Reported on 10/29/2014   30 tablet   0   . fluticasone (FLONASE) 50 MCG/ACT nasal spray   Each Nare   Place 1 spray into both nostrils daily.   16 g   0   . guaiFENesin-codeine 100-10 MG/5ML syrup   Oral   Take 10 mLs by mouth 3 (three) times daily as needed.   120 mL   0   . HYDROcodone-acetaminophen (NORCO/VICODIN) 5-325 MG per tablet   Oral   Take 1 tablet by mouth every 6 (six) hours as needed.   10 tablet   0   . predniSONE (STERAPRED UNI-PAK 21 TAB) 10 MG (21) TBPK tablet      Take 6 tablets on day 1 Take 5 tablets on day 2  Take 4 tablets on day 3 Take 3 tablets on day 4 Take 2 tablets on day 5 Take 1 tablet on day 6   21 tablet   0   . risperiDONE (RISPERDAL M-TABS) 2 MG disintegrating tablet   Oral   Take 1 tablet (2 mg total) by mouth at bedtime. Patient not taking: Reported on 10/29/2014   30 tablet   0   . sertraline (ZOLOFT) 100 MG tablet   Oral   Take 100 mg by mouth daily.      5     Allergies Antihistamines, diphenhydramine-type; Penicillins; and Sulfa antibiotics  Family History  Problem Relation Age of Onset  . Anesthesia problems Neg Hx   . Hypotension Neg Hx   . Malignant hyperthermia Neg Hx   . Pseudochol deficiency Neg Hx     Social History Social History  Substance Use Topics  . Smoking status: Current Every Day Smoker -- 0.50 packs/day  . Smokeless tobacco: Not on file  . Alcohol Use: No    Review of Systems  Constitutional: Denies fever and chills Gastrointestinal: Denies nausea/vomiting/abdominal pain Respiratory: Denies difficulty breathing or shortness of breath Neurological: Mild tingling in her right index finger post injury Musculoskeletal: Pain in the right index  finger, no other injuries.  ____________________________________________   PHYSICAL EXAM:  VITAL SIGNS: ED Triage Vitals  Enc Vitals Group     BP 03/12/16 2335 140/94 mmHg     Pulse Rate 03/12/16 2335 103     Resp 03/12/16 2335 20     Temp 03/12/16 2335 97.9 F (36.6 C)     Temp Source 03/12/16 2335 Oral     SpO2 03/12/16 2335 99 %     Weight 03/12/16 2335 120 lb (54.432 kg)     Height 03/12/16 2335  (1.499 m)     Head Cir --      Peak Flow --      Pain Score 03/12/16 2335 6     Pain Loc --      Pain Edu? --      Excl. in GC? --     Constitutional:  Well appearing, no acute distress, vital signs unremarkable Head: Traumatic Respiratory: Normal work of breathing, no respiratory distress, no accessory muscle usage Skin: No rash, lacerations, nor abrasions Musculoskeletal: No other injuries except for right index finger.  The patient has mild swelling throughout the right index finger most notable at the volar side of the PIP.  She has significant pain with flexion of the finger.  There is no visible injury or obvious deformity.  ____________________________________________    LABS (pertinent positives/negatives)  None  ____________________________________________   EKG  None  ____________________________________________    RADIOLOGY  Dg Finger Index Right  03/13/2016  CLINICAL DATA:  Acute onset right index finger pain, after hitting it on countertop. Initial encounter. EXAM: RIGHT INDEX FINGER 2+V COMPARISON:  None. FINDINGS: There is no evidence of fracture or dislocation. Visualized joint spaces are preserved. No definite soft tissue abnormalities are characterized. IMPRESSION: No evidence of fracture or dislocation. Electronically Signed   By: Roanna Raider M.D.   On: 03/13/2016 00:10     ____________________________________________   PROCEDURES  Procedure(s) performed: None  ____________________________________________   INITIAL IMPRESSION  / ASSESSMENT AND PLAN / ED COURSE  Pertinent labs & imaging results that were available during my care of the patient were reviewed by me and considered in my medical decision making (see chart for details).  Asian and  has no obvious fracture nor dislocation of her right index finger and no deformity.  She has a significant amount of pain with range of motion which I suspect is due to the sprain of the finger.  I gave her the option of AlumaFoam splint versus buddy taping to her middle finger and she prefers the buddy tape.  I recommended over-the-counter pain medication and outpatient follow-up with orthopedics in about a week if she continues to have pain.  She understands and agrees with the plan.  ____________________________________________   FINAL CLINICAL IMPRESSION(S) / ED DIAGNOSES  Final diagnoses:  Finger sprain, initial encounter       Loleta Rose, MD 03/13/16 [id number]

## 2016-03-13 NOTE — Discharge Instructions (Signed)
Finger Sprain A finger sprain is a tear in one of the strong, fibrous tissues that connect the bones (ligaments) in your finger. The severity of the sprain depends on how much of the ligament is torn. The tear can be either partial or complete. CAUSES  Often, sprains are a result of a fall or accident. If you extend your hands to catch an object or to protect yourself, the force of the impact causes the fibers of your ligament to stretch too much. This excess tension causes the fibers of your ligament to tear. SYMPTOMS  You may have some loss of motion in your finger. Other symptoms include:  Bruising.  Tenderness.  Swelling. DIAGNOSIS  In order to diagnose finger sprain, your caregiver will physically examine your finger or thumb to determine how torn the ligament is. Your caregiver may also suggest an X-ray exam of your finger to make sure no bones are broken. TREATMENT  If your ligament is only partially torn, treatment usually involves keeping the finger in a fixed position (immobilization) for a short period. To do this, your caregiver will apply a bandage, cast, or splint to keep your finger from moving until it heals. For a partially torn ligament, the healing process usually takes 2 to 3 weeks. If your ligament is completely torn, you may need surgery to reconnect the ligament to the bone. After surgery a cast or splint will be applied and will need to stay on your finger or thumb for 4 to 6 weeks while your ligament heals. HOME CARE INSTRUCTIONS  Keep your injured finger elevated, when possible, to decrease swelling.  To ease pain and swelling, apply ice to your joint twice a day, for 2 to 3 days:  Put ice in a plastic bag.  Place a towel between your skin and the bag.  Leave the ice on for 15 minutes.  Only take over-the-counter or prescription medicine for pain as directed by your caregiver.  Do not wear rings on your injured finger.  Do not leave your finger unprotected  until pain and stiffness go away (usually 3 to 4 weeks).  Do not allow your cast or splint to get wet. Cover your cast or splint with a plastic bag when you shower or bathe. Do not swim.  Your caregiver may suggest special exercises for you to do during your recovery to prevent or limit permanent stiffness. SEEK IMMEDIATE MEDICAL CARE IF:  Your cast or splint becomes damaged.  Your pain becomes worse rather than better. MAKE SURE YOU:  Understand these instructions.  Will watch your condition.  Will get help right away if you are not doing well or get worse.   This information is not intended to replace advice given to you by your health care provider. Make sure you discuss any questions you have with your health care provider.   Document Released: 12/11/2004 Document Revised: 11/24/2014 Document Reviewed: 07/07/2011 Elsevier Interactive Patient Education 2016 [employer]    Cryotherapy Cryotherapy means treatment with cold. Ice or gel packs can be used to reduce both pain and swelling. Ice is the most helpful within the first 24 to 48 hours after an injury or flare-up from overusing a muscle or joint. Sprains, strains, spasms, burning pain, shooting pain, and aches can all be eased with ice. Ice can also be used when recovering from surgery. Ice is effective, has very few side effects, and is safe for most people to use. PRECAUTIONS  Ice is not a safe treatment  people with: °· Raynaud phenomenon. This is a condition affecting small blood vessels in the extremities. Exposure to cold may cause your problems to return. °· Cold hypersensitivity. There are many forms of cold hypersensitivity, including: °¨ Cold urticaria. Red, itchy hives appear on the skin when the tissues begin to warm after being iced. °¨ Cold erythema. This is a red, itchy rash caused by exposure to cold. °¨ Cold hemoglobinuria. Red blood cells break down when the tissues begin to warm after being iced. The  hemoglobin that carry oxygen are passed into the urine because they cannot combine with blood proteins fast enough. °· Numbness or altered sensitivity in the area being iced. °If you have any of the following conditions, do not use ice until you have discussed cryotherapy with your caregiver: °· Heart conditions, such as arrhythmia, angina, or chronic heart disease. °· High blood pressure. °· Healing wounds or open skin in the area being iced. °· Current infections. °· Rheumatoid arthritis. °· Poor circulation. °· Diabetes. °Ice slows the blood flow in the region it is applied. This is beneficial when trying to stop inflamed tissues from spreading irritating chemicals to surrounding tissues. However, if you expose your skin to cold temperatures for too long or without the proper protection, you can damage your skin or nerves. Watch for signs of skin damage due to cold. °HOME CARE INSTRUCTIONS °Follow these tips to use ice and cold packs safely. °· Place a dry or damp towel between the ice and skin. A damp towel will cool the skin more quickly, so you may need to shorten the time that the ice is used. °· For a more rapid response, add gentle compression to the ice. °· Ice for no more than 10 to 20 minutes at a time. The bonier the area you are icing, the less time it will take to get the benefits of ice. °· Check your skin after 5 minutes to make sure there are no signs of a poor response to cold or skin damage. °· Rest 20 minutes or more between uses. °· Once your skin is numb, you can end your treatment. You can test numbness by very lightly touching your skin. The touch should be so light that you do not see the skin dimple from the pressure of your fingertip. When using ice, most people will feel these normal sensations in this order: cold, burning, aching, and numbness. °· Do not use ice on someone who cannot communicate their responses to pain, such as small children or people with dementia. °HOW TO MAKE AN ICE  PACK °Ice packs are the most common way to use ice therapy. Other methods include ice massage, ice baths, and cryosprays. Muscle creams that cause a cold, tingly feeling do not offer the same benefits that ice offers and should not be used as a substitute unless recommended by your caregiver. °To make an ice pack, do one of the following: °· Place crushed ice or a bag of frozen vegetables in a sealable plastic bag. Squeeze out the excess air. Place this bag inside another plastic bag. Slide the bag into a pillowcase or place a damp towel between your skin and the bag. °· Mix 3 parts water with 1 part rubbing alcohol. Freeze the mixture in a sealable plastic bag. When you remove the mixture from the freezer, it will be slushy. Squeeze out the excess air. Place this bag inside another plastic bag. Slide the bag into a pillowcase or place a damp towel   between your skin and the bag. °SEEK MEDICAL CARE IF: °· You develop white spots on your skin. This may give the skin a blotchy (mottled) appearance. °· Your skin turns blue or pale. °· Your skin becomes waxy or hard. °· Your swelling gets worse. °MAKE SURE YOU:  °· Understand these instructions. °· Will watch your condition. °· Will get help right away if you are not doing well or get worse. °  °This information is not intended to replace advice given to you by your health care provider. Make sure you discuss any questions you have with your health care provider. °  °Document Released: 06/30/2011 Document Revised: 11/24/2014 Document Reviewed: 06/30/2011 °Elsevier Interactive Patient Education ©2016 [employer] ° °

## 2016-03-13 NOTE — ED Notes (Signed)
1/2" non-latex tape applied to affected and middle finger in buddy configuration.  Patient will take remaining tape home, patient educated on tape removal process to lessen aggravation to affected finger.

## 2016-06-07 ENCOUNTER — Encounter: Payer: Self-pay | Admitting: Emergency Medicine

## 2016-06-07 ENCOUNTER — Emergency Department
Admission: EM | Admit: 2016-06-07 | Discharge: 2016-06-07 | Disposition: A | Discharge status: Home / Self Care | Payer: Medicaid Other | Attending: Emergency Medicine | Admitting: Emergency Medicine

## 2016-06-07 DIAGNOSIS — Z7951 Long term (current) use of inhaled steroids: Secondary | ICD-10-CM | POA: Insufficient documentation

## 2016-06-07 DIAGNOSIS — Z79899 Other long term (current) drug therapy: Secondary | ICD-10-CM | POA: Insufficient documentation

## 2016-06-07 DIAGNOSIS — Z7952 Long term (current) use of systemic steroids: Secondary | ICD-10-CM | POA: Insufficient documentation

## 2016-06-07 DIAGNOSIS — F17200 Nicotine dependence, unspecified, uncomplicated: Secondary | ICD-10-CM | POA: Insufficient documentation

## 2016-06-07 DIAGNOSIS — J0100 Acute maxillary sinusitis, unspecified: Secondary | ICD-10-CM | POA: Insufficient documentation

## 2016-06-07 MED ORDER — DOXYCYCLINE HYCLATE 100 MG PO TABS: 100.0000 mg | ORAL_TABLET | Freq: Two times a day (BID) | ORAL | Status: DC

## 2016-06-07 NOTE — ED Notes (Signed)
Sinus pressure and coughing x 1 day.  Has been taking benadryl, no relief.

## 2016-06-07 NOTE — ED Notes (Signed)
AAOx3.  Skin warm and dry.  MAE equally and strong.  NAD 

## 2016-06-07 NOTE — Discharge Instructions (Signed)

## 2016-06-07 NOTE — ED Provider Notes (Signed)
[facility] Emergency Department Provider Note  ____________________________________________  Time seen: Approximately 8:15 PM  I have reviewed the triage vital signs and the nursing notes.   HISTORY  Chief Complaint Facial Pain    HPI [name] is a 40 y.o. female who presents today for evaluation of facial pain. Patient states that on Thursday she began sneezing and having a runny nose. She denies cough, nausea, vomiting, fever, or chills. She has been taking Benadryl and Aleve without symptom relief. She states that she is having a "pressure head ache". She denies any sick contacts.   Past Medical History  Diagnosis Date  . Endometriosis   . Migraines   . Ovarian cyst   . Anxiety   . Pregnancy induced hypertension     Patient Active Problem List   Diagnosis Date Noted  . 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 Psychotic disorder 06/10/2014    Past Surgical History  Procedure Laterality Date  . Laparoscopy      x5  . Abdominal hysterectomy      2009  . Cesarean section      1999  . Tubal ligation    . Left oophorectomy      Current Outpatient Rx  Name  Route  Sig  Dispense  Refill  . acetaminophen (TYLENOL) 500 MG tablet   Oral   Take 1,000 mg by mouth every 6 (six) hours as needed for mild pain.         Marland Kitchen ALPRAZolam (XANAX) 1 MG tablet   Oral   Take 1 mg by mouth 2 (two) times daily.         . benzonatate (TESSALON PERLES) 100 MG capsule   Oral   Take 1 capsule (100 mg total) by mouth 3 (three) times daily as needed for cough (Take 1-2 per dose).   30 capsule   0   . busPIRone (BUSPAR) 10 MG tablet   Oral   Take 1 tablet (10 mg total) by mouth 2 (two) times daily. Patient not taking: Reported on 10/29/2014   60 tablet   0   . clonazePAM (KLONOPIN) 0.5 MG tablet   Oral   Take 0.5 mg by mouth 2 (two) times daily.       2   . doxycycline (VIBRA-TABS) 100 MG tablet   Oral    Take 1 tablet (100 mg total) by mouth 2 (two) times daily.   14 tablet   0   . escitalopram (LEXAPRO) 10 MG tablet   Oral   Take 1 tablet (10 mg total) by mouth daily. Patient not taking: Reported on 10/29/2014   30 tablet   0   . fluticasone (FLONASE) 50 MCG/ACT nasal spray   Each Nare   Place 1 spray into both nostrils daily.   16 g   0   . guaiFENesin-codeine 100-10 MG/5ML syrup   Oral   Take 10 mLs by mouth 3 (three) times daily as needed.   120 mL   0   . HYDROcodone-acetaminophen (NORCO/VICODIN) 5-325 MG per tablet   Oral   Take 1 tablet by mouth every 6 (six) hours as needed.   10 tablet   0   . predniSONE (STERAPRED UNI-PAK 21 TAB) 10 MG (21) TBPK tablet      Take 6 tablets on day 1 Take 5 tablets on day 2 Take 4 tablets on day 3 Take 3 tablets on day 4 Take 2 tablets  on day 5 Take 1 tablet on day 6   21 tablet   0   . risperiDONE (RISPERDAL M-TABS) 2 MG disintegrating tablet   Oral   Take 1 tablet (2 mg total) by mouth at bedtime. Patient not taking: Reported on 10/29/2014   30 tablet   0   . sertraline (ZOLOFT) 100 MG tablet   Oral   Take 100 mg by mouth daily.      5     Allergies Antihistamines, diphenhydramine-type; Penicillins; and Sulfa antibiotics  Family History  Problem Relation Age of Onset  . Anesthesia problems Neg Hx   . Hypotension Neg Hx   . Malignant hyperthermia Neg Hx   . Pseudochol deficiency Neg Hx     Social History Social History  Substance Use Topics  . Smoking status: Current Every Day Smoker -- 0.50 packs/day  . Smokeless tobacco: None  . Alcohol Use: No     Review of Systems Constitutional: No fever/chills Eyes: No visual changes. No discharge ENT: No sore throat. Cardiovascular: No chest pain. Respiratory: No cough. No shortness of breath. No wheezing.  Gastrointestinal: No abdominal pain.  No nausea, vomiting.  No diarrhea.  No constipation. Musculoskeletal: Negative for back pain.  Skin: Negative  for rash. Neurological: Patient states she has had pressure-like headaches, focal weakness or numbness. 10-point ROS otherwise negative.  ____________________________________________   PHYSICAL EXAM:  VITAL SIGNS: ED Triage Vitals  Enc Vitals Group     BP 06/07/16 1900 113/78 mmHg     Pulse Rate 06/07/16 1900 92     Resp 06/07/16 1900 18     Temp 06/07/16 1900 98.5 F (36.9 C)     Temp Source 06/07/16 1900 Oral     SpO2 06/07/16 1900 97 %     Weight 06/07/16 1900 118 lb (53.524 kg)     Height 06/07/16 1900 4\' 11"  (1.499 m)     Head Cir --      Peak Flow --      Pain Score 06/07/16 1901 0     Pain Loc --      Pain Edu? --      Excl. in GC? --      Constitutional: Alert and oriented. Well appearing and in no acute distress. Eyes: Conjunctivae are normal.  Head: Atraumatic. Pain on palpitation of frontal and maxillary sinuses ENT:      Nose: No congestion/rhinnorhea.      Mouth/Throat: Mucous membranes are moist. Turbinates are erythematous and slightly boggy. Tender to percussion over frontal maxillary sinuses. Neck: No stridor.   Hematological/Lymphatic/Immunilogical: No cervical lymphadenopathy. Cardiovascular: Normal rate, regular rhythm. Normal S1 and S2.  Good peripheral circulation. Respiratory: Normal respiratory effort without tachypnea or retractions. Lungs CTAB. Gastrointestinal: Soft and nontender. No distention. No CVA tenderness. Musculoskeletal: No lower extremity tenderness nor edema.  No joint effusions. Neurologic:  Normal speech and language. No gross focal neurologic deficits are appreciated.  Skin:  Skin is warm, dry and intact. No rash noted. Psychiatric: Mood and affect are normal. Speech and behavior are normal. Patient exhibits appropriate insight and judgement.   ____________________________________________   LABS (all labs ordered are listed, but only abnormal results are displayed)  Labs Reviewed - No data to  display ____________________________________________  EKG  None ____________________________________________  RADIOLOGY   No results found.  ____________________________________________    PROCEDURES  Procedure(s) performed: None      Medications - No data to display   ____________________________________________   INITIAL IMPRESSION /  ASSESSMENT AND PLAN / ED COURSE  Pertinent labs & imaging results that were available during my care of the patient were reviewed by me and considered in my medical decision making (see chart for details).  Patient's diagnosis is consistent with Sinusitis. Patient will be discharged home with prescriptions for doxycycline as patient is allergic to penicillin based antibiotics. She is advised to use Zyrtec and Flonase over-the-counter for additional symptom relief.. Patient is to follow up with PCP if symptoms persist past this treatment course. Patient is given ED precautions to return to the ED for any worsening or new symptoms.      ____________________________________________  FINAL CLINICAL IMPRESSION(S) / ED DIAGNOSES  Final diagnoses:  Acute maxillary sinusitis, recurrence not specified      NEW MEDICATIONS STARTED DURING THIS VISIT:  New Prescriptions   DOXYCYCLINE (VIBRA-TABS) 100 MG TABLET    Take 1 tablet (100 mg total) by mouth 2 (two) times daily.          Delorise Royals Lorris Carducci, PA-C 06/07/16 1610  Sharyn Creamer, MD 06/08/16 [phone]

## 2016-07-22 IMAGING — US US PELV - US TRANSVAGINAL
1 series · 13 of 25 positions shown · non-contrast
Comparison: 11/14/2009.

CLINICAL DATA: 38-year-old female with right lower quadrant pain
times 10 hours. Current history of post hysterectomy and left
oophorectomy. Not on hormone replacement therapy. Initial encounter.

EXAM:
TRANSABDOMINAL AND TRANSVAGINAL ULTRASOUND OF PELVIS
DOPPLER ULTRASOUND OF OVARIES
TECHNIQUE: Both transabdominal and transvaginal ultrasound examinations of the
pelvis were performed. Transabdominal technique was performed for
global imaging of the pelvis including uterus, ovaries, adnexal
regions, and pelvic cul-de-sac.
It was necessary to proceed with endovaginal exam following the
transabdominal exam to visualize the right ovary eat. Color and
duplex Doppler ultrasound was utilized to evaluate blood flow to the
ovaries.

[Series 1: us pelv - us transvaginal · 0.22mm/px · 13 of 49 slices shown]
[im 1/49]
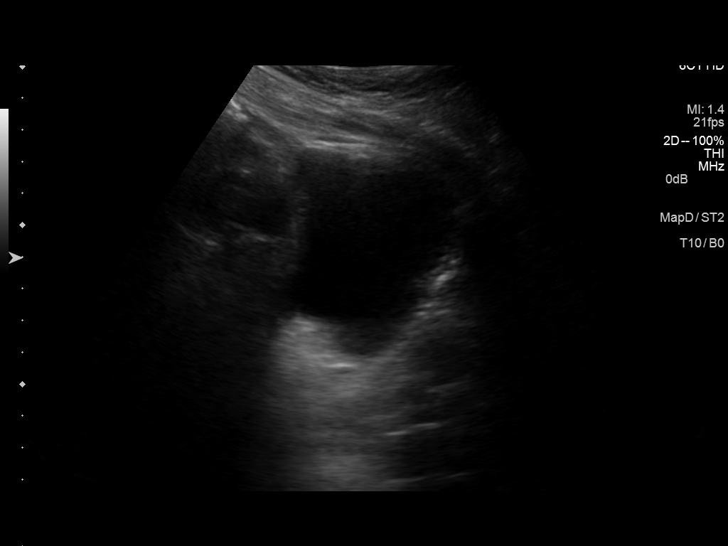
[im 5/49]
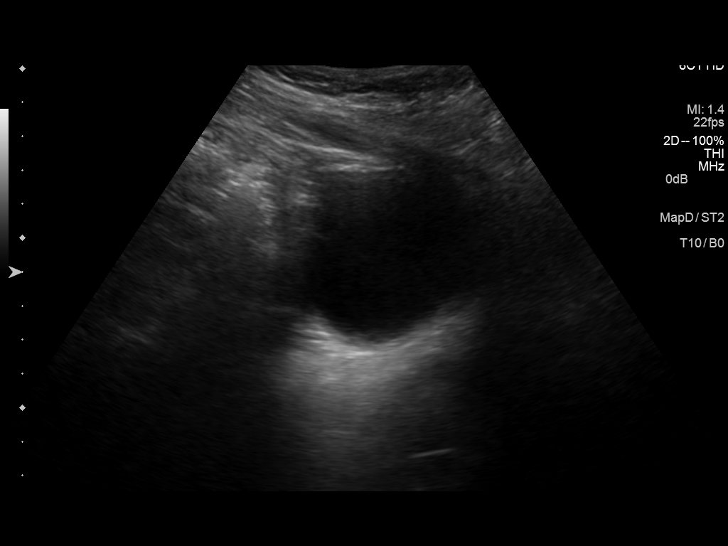
[im 9/49]
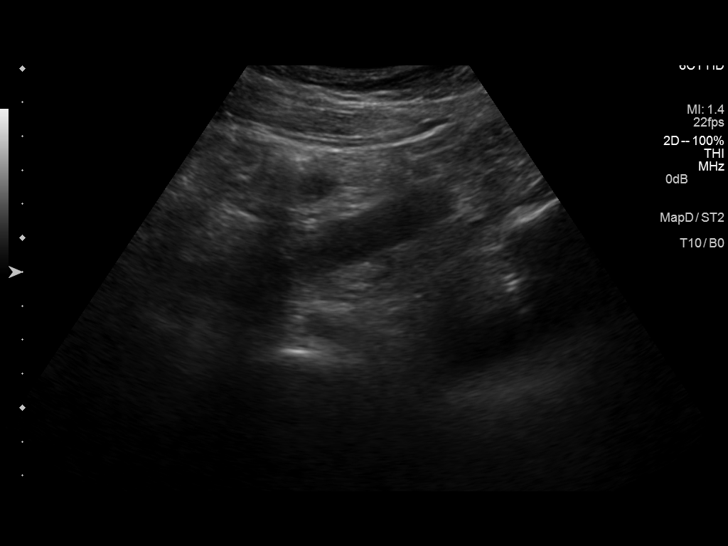
[im 13/49]
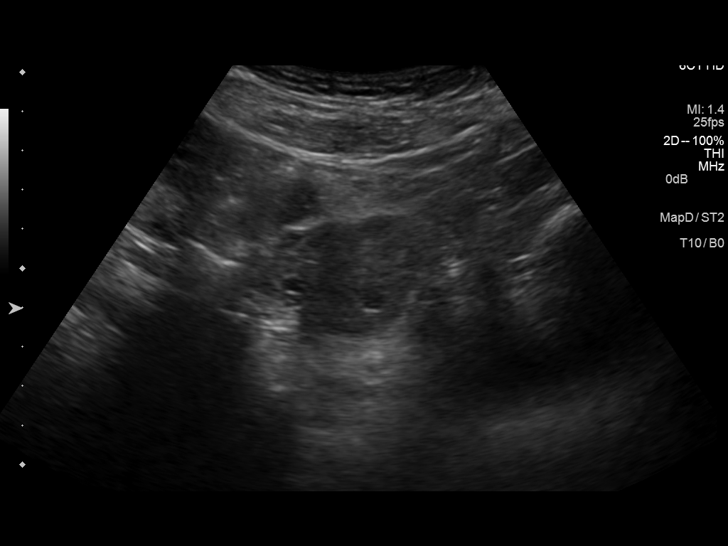
[im 17/49]
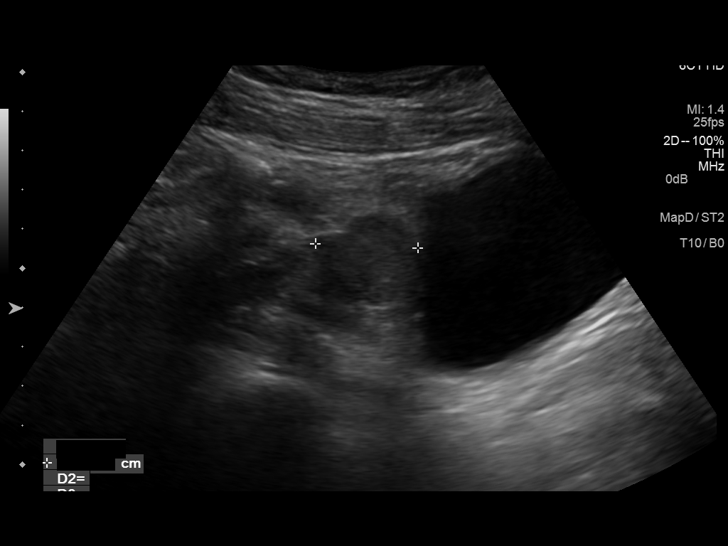
[im 21/49]
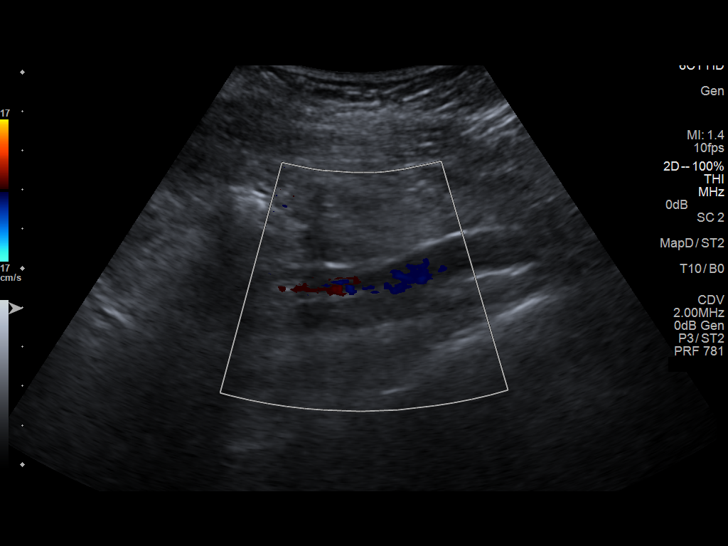
[im 25/49]
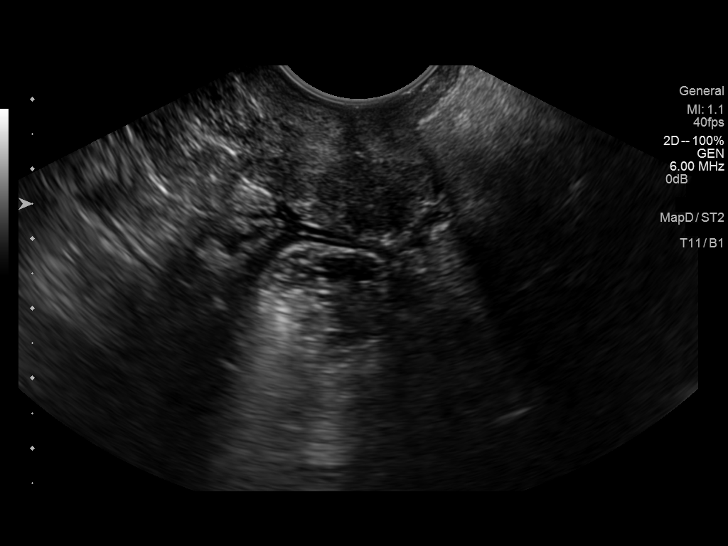
[im 29/49]
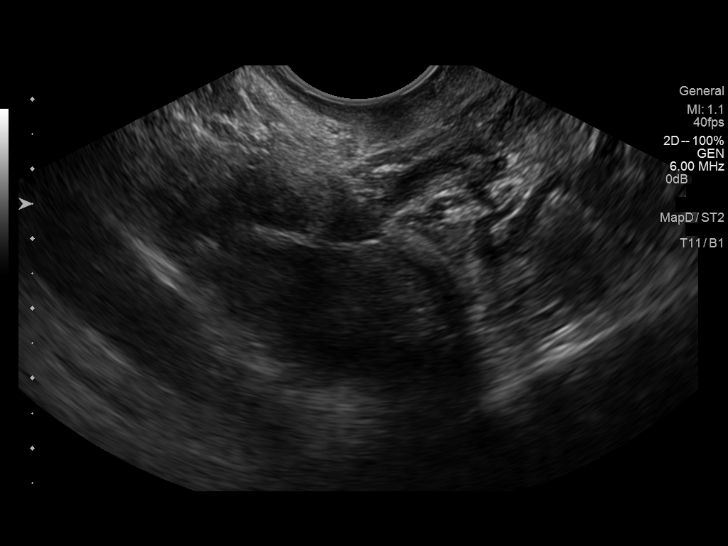
[im 33/49]
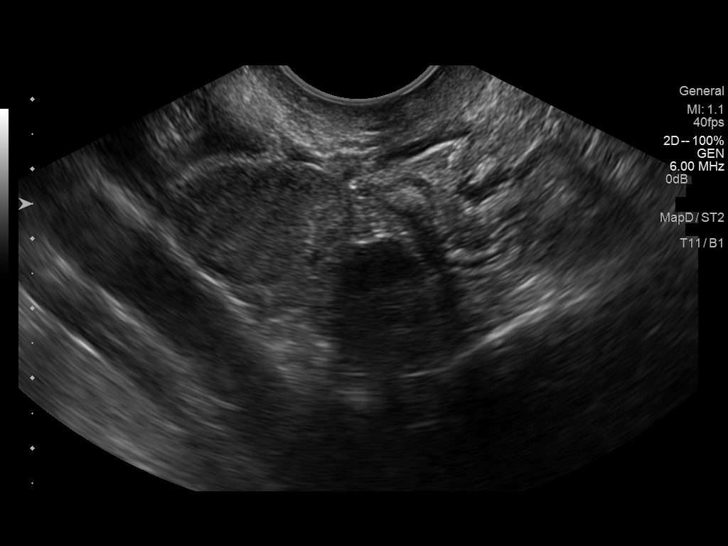
[im 37/49]
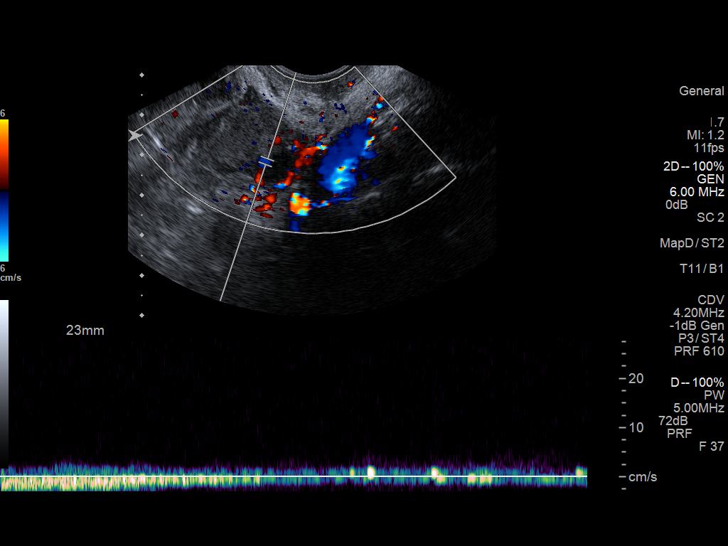
[im 41/49]
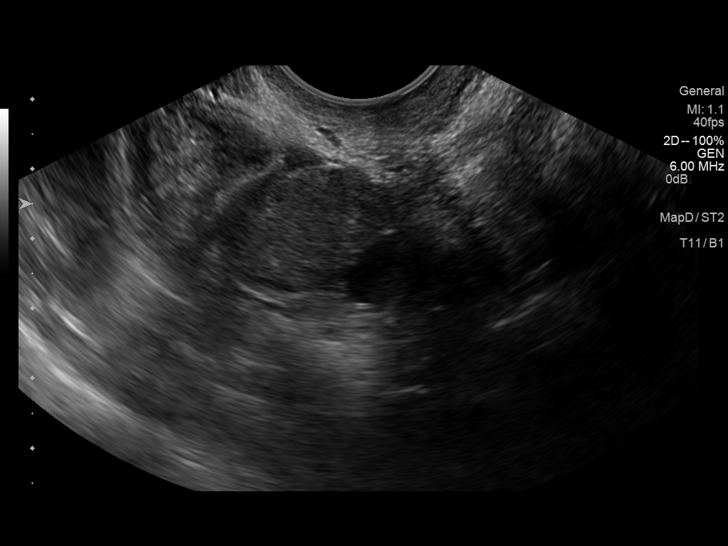
[im 45/49]
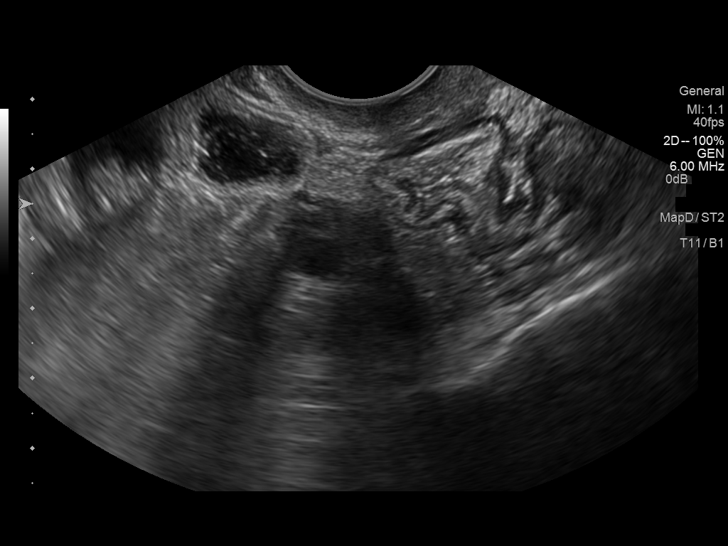
[im 49/49]
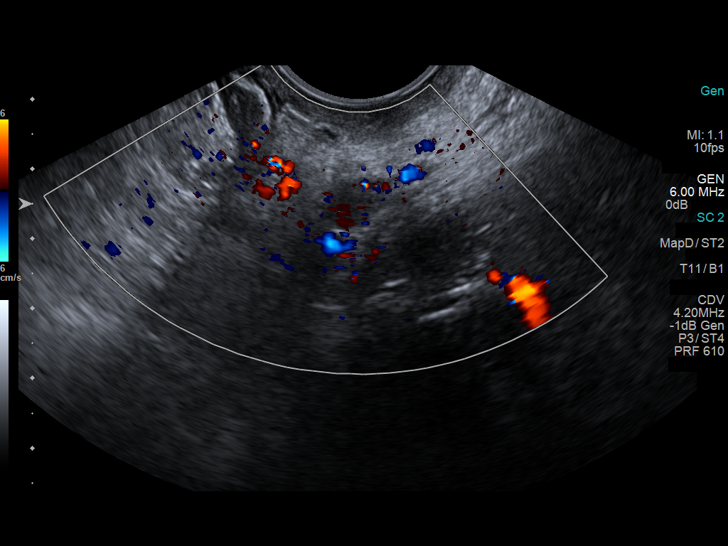

[13 of 25 positions shown; findings below may reference images not displayed]

FINDINGS: Uterus

Measurements: Surgically absent.

Endometrium

Thickness: Surgically absent.

Right ovary

Measurements: 3.2 x 2.3 x 2.5 cm. Several small follicles. There is
an adjacent 16 mm hypoechoic mildly complex area with no definite
solid or vascular elements. This is best depicted on image 35.
Pulsed Doppler evaluation of both ovaries demonstrates normal
low-resistance arterial and venous waveforms.

Left ovary

Measurements: Surgically absent.

Other findings

No free fluid.
IMPRESSION: 1. Right ovary with no evidence of torsion. Mildly complex 16 mm
probable hemorrhagic cyst. No followup necessary in this age group.
This recommendation follows the consensus statement: Management of
Asymptomatic Ovarian and Other Adnexal Cysts Imaged at US: Society
of Radiologists in Ultrasound Consensus Conference Statement.
2. Surgically absent uterus and left ovary.  No pelvic free fluid.

## 2016-09-12 ENCOUNTER — Encounter: Payer: Self-pay | Admitting: Emergency Medicine

## 2016-09-12 ENCOUNTER — Emergency Department
Admission: EM | Admit: 2016-09-12 | Discharge: 2016-09-12 | Disposition: A | Discharge status: Home / Self Care | Payer: Medicaid Other | Attending: Emergency Medicine | Admitting: Emergency Medicine

## 2016-09-12 ENCOUNTER — Emergency Department: Payer: Medicaid Other

## 2016-09-12 DIAGNOSIS — F17200 Nicotine dependence, unspecified, uncomplicated: Secondary | ICD-10-CM | POA: Diagnosis not present

## 2016-09-12 DIAGNOSIS — R1031 Right lower quadrant pain: Secondary | ICD-10-CM | POA: Diagnosis present

## 2016-09-12 DIAGNOSIS — R112 Nausea with vomiting, unspecified: Secondary | ICD-10-CM | POA: Diagnosis not present

## 2016-09-12 DIAGNOSIS — Z79899 Other long term (current) drug therapy: Secondary | ICD-10-CM | POA: Diagnosis not present

## 2016-09-12 LAB — COMPREHENSIVE METABOLIC PANEL
ALBUMIN: 3.9 g/dL (ref 3.5–5.0)
ALK PHOS: 47 U/L (ref 38–126)
ALT: 16 U/L (ref 14–54)
ANION GAP: 6 (ref 5–15)
AST: 25 U/L (ref 15–41)
BILIRUBIN TOTAL: 0.6 mg/dL (ref 0.3–1.2)
BUN: 10 mg/dL (ref 6–20)
CALCIUM: 8.8 mg/dL — AB (ref 8.9–10.3)
CO2: 27 mmol/L (ref 22–32)
Chloride: 105 mmol/L (ref 101–111)
Creatinine, Ser: 1.2 mg/dL — ABNORMAL HIGH (ref 0.44–1.00)
GFR calc non Af Amer: 56 mL/min — ABNORMAL LOW (ref >60)
GLUCOSE: 103 mg/dL — AB (ref 65–99)
POTASSIUM: 4.2 mmol/L (ref 3.5–5.1)
SODIUM: 138 mmol/L (ref 135–145)
TOTAL PROTEIN: 7.1 g/dL (ref 6.5–8.1)

## 2016-09-12 LAB — CBC WITH DIFFERENTIAL/PLATELET
BASOS PCT: 0 %
Basophils Absolute: 0 10*3/uL (ref 0–0.1)
EOS ABS: 0.1 10*3/uL (ref 0–0.7)
Eosinophils Relative: 1 %
HCT: 44.2 % (ref 35.0–47.0)
Hemoglobin: 15.1 g/dL (ref 12.0–16.0)
LYMPHS ABS: 1.2 10*3/uL (ref 1.0–3.6)
Lymphocytes Relative: 16 %
MCH: 31.6 pg (ref 26.0–34.0)
MCHC: 34.2 g/dL (ref 32.0–36.0)
MCV: 92.4 fL (ref 80.0–100.0)
MONO ABS: 0.6 10*3/uL (ref 0.2–0.9)
MONOS PCT: 8 %
Neutro Abs: 5.5 10*3/uL (ref 1.4–6.5)
Neutrophils Relative %: 75 %
Platelets: 246 10*3/uL (ref 150–440)
RBC: 4.78 MIL/uL (ref 3.80–5.20)
RDW: 13.1 % (ref 11.5–14.5)
WBC: 7.5 10*3/uL (ref 3.6–11.0)

## 2016-09-12 LAB — URINALYSIS COMPLETE WITH MICROSCOPIC (ARMC ONLY)
Bilirubin Urine: NEGATIVE
GLUCOSE, UA: NEGATIVE mg/dL
HGB URINE DIPSTICK: NEGATIVE
KETONES UR: NEGATIVE mg/dL
LEUKOCYTES UA: NEGATIVE
NITRITE: NEGATIVE
Protein, ur: NEGATIVE mg/dL
SPECIFIC GRAVITY, URINE: 1.019 (ref 1.005–1.030)
pH: 7 (ref 5.0–8.0)

## 2016-09-12 LAB — LIPASE, BLOOD: Lipase: 60 U/L — ABNORMAL HIGH (ref 11–51)

## 2016-09-12 LAB — POCT PREGNANCY, URINE: Preg Test, Ur: NEGATIVE

## 2016-09-12 MED ORDER — ONDANSETRON HCL 4 MG/2ML IJ SOLN
4.0000 mg | Freq: Once | INTRAMUSCULAR | Status: AC
  Administered 2016-09-12: 4 mg via INTRAVENOUS
  Filled 2016-09-12: qty 2

## 2016-09-12 MED ORDER — MORPHINE SULFATE (PF) 2 MG/ML IV SOLN
4.0000 mg | Freq: Once | INTRAVENOUS | Status: AC
Start: 2016-09-12 — End: 2016-09-12
  Administered 2016-09-12: 4 mg via INTRAVENOUS
  Filled 2016-09-12: qty 2

## 2016-09-12 MED ORDER — SODIUM CHLORIDE 0.9 % IV BOLUS (SEPSIS)
1000.0000 mL | Freq: Once | INTRAVENOUS | Status: AC
  Administered 2016-09-12: 1000 mL via INTRAVENOUS

## 2016-09-12 NOTE — ED Notes (Signed)
Pt presents with hx of ovarian cysts, endometriosis. Pt has had hysterectomy, retains right ovary. Pt states this feels similar to prior ovarian cysts. She states that the nausea and vomiting are not typical of the cyst incidents in the past. Pt tearful during assessment.

## 2016-09-12 NOTE — ED Provider Notes (Signed)
[facility] Emergency Department Provider Note  ____________________________________________  Time seen: Approximately 7:35 AM  I have reviewed the triage vital signs and the nursing notes.   HISTORY  Chief Complaint Abdominal Pain; Nausea; and Emesis   HPI [name] is a 40 y.o. female with h/o endometriosis and ovarian cyst who presents for evaluation of right lower quadrant abdominal pain. Patient reports the pain started suddenly at 5:30 AM this morning. The pain as a 9 out of 10, sharp stabbing in quality, located in her right lower pelvis and radiating to her back and associated with a few episodes of nonbloody nonbilious emesis. Patient reports she has had ovarian cysts in the past however this pain is more intense and she usually does not have vomiting with it. She has had multiple prior abdominal surgeries including hysterectomy, multiple laparoscopic surgeries for endometriosis and a C-section. She denies abdominal distention. She is passing gas. Last BM was this morning. No fever chills, no dysuria or hematuria, no vaginal discharge. Patient has taken 400 mg of ibuprofen at home with no significant improvement of her pain.  Past Medical History:  Diagnosis Date  . Anxiety   . Endometriosis   . Migraines   . Ovarian cyst   . Pregnancy induced hypertension     Patient Active Problem List   Diagnosis Date Noted  . 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 Psychotic disorder 06/10/2014    Past Surgical History:  Procedure Laterality Date  . ABDOMINAL HYSTERECTOMY     2009  . CESAREAN SECTION     1999  . LAPAROSCOPY     x5  . LEFT OOPHORECTOMY    . TUBAL LIGATION      Prior to Admission medications   Medication Sig Start Date End Date Taking? Authorizing Provider  acetaminophen (TYLENOL) 500 MG tablet Take 1,000 mg by mouth every 6 (six) hours as needed for mild pain.    Historical Provider, MD    ALPRAZolam Prudy Feeler) 1 MG tablet Take 1 mg by mouth 2 (two) times daily.    Historical Provider, MD  benzonatate (TESSALON PERLES) 100 MG capsule Take 1 capsule (100 mg total) by mouth 3 (three) times daily as needed for cough (Take 1-2 per dose). 11/08/15   Jenise V Bacon Menshew, PA-C  busPIRone (BUSPAR) 10 MG tablet Take 1 tablet (10 mg total) by mouth 2 (two) times daily. Patient not taking: Reported on 10/29/2014 06/15/14   Thermon Leyland, NP  clonazePAM (KLONOPIN) 0.5 MG tablet Take 0.5 mg by mouth 2 (two) times daily.  09/16/14   Historical Provider, MD  doxycycline (VIBRA-TABS) 100 MG tablet Take 1 tablet (100 mg total) by mouth 2 (two) times daily. 06/07/16   Delorise Royals Cuthriell, PA-C  escitalopram (LEXAPRO) 10 MG tablet Take 1 tablet (10 mg total) by mouth daily. Patient not taking: Reported on 10/29/2014 06/15/14   Thermon Leyland, NP  fluticasone [facility]) 50 MCG/ACT nasal spray Place 1 spray into both nostrils daily. 11/08/15   Jenise V Bacon Menshew, PA-C  guaiFENesin-codeine 100-10 MG/5ML syrup Take 10 mLs by mouth 3 (three) times daily as needed. 11/07/15   Chinita Pester, FNP  HYDROcodone-acetaminophen (NORCO/VICODIN) 5-325 MG per tablet Take 1 tablet by mouth every 6 (six) hours as needed. 10/29/14   Rodell Perna, NP  predniSONE (STERAPRED UNI-PAK 21 TAB) 10 MG (21) TBPK tablet Take 6 tablets on day 1 Take 5 tablets on day 2 Take  4 tablets on day 3 Take 3 tablets on day 4 Take 2 tablets on day 5 Take 1 tablet on day 6 11/07/15   Cari B Triplett, FNP  risperiDONE (RISPERDAL M-TABS) 2 MG disintegrating tablet Take 1 tablet (2 mg total) by mouth at bedtime. Patient not taking: Reported on 10/29/2014 06/15/14   Thermon Leyland, NP  sertraline (ZOLOFT) 100 MG tablet Take 100 mg by mouth daily. 09/20/14   Historical Provider, MD    Allergies Antihistamines, diphenhydramine-type; Penicillins; and Sulfa antibiotics  Family History  Problem Relation Age of Onset  . Anesthesia problems  Neg Hx   . Hypotension Neg Hx   . Malignant hyperthermia Neg Hx   . Pseudochol deficiency Neg Hx     Social History Social History  Substance Use Topics  . Smoking status: Current Every Day Smoker    Packs/day: 0.50  . Smokeless tobacco: Never Used  . Alcohol use No    Review of Systems  Constitutional: Negative for fever. Eyes: Negative for visual changes. ENT: Negative for sore throat. Cardiovascular: Negative for chest pain. Respiratory: Negative for shortness of breath. Gastrointestinal: + RLQ abdominal pain and vomiting. No diarrhea. Genitourinary: Negative for dysuria. Musculoskeletal: Negative for back pain. Skin: Negative for rash. Neurological: Negative for headaches, weakness or numbness.  ____________________________________________   PHYSICAL EXAM:  VITAL SIGNS: ED Triage Vitals [09/12/16 0715]  Enc Vitals Group     BP 102/73     Pulse Rate (!) 101     Resp 20     Temp 98 F (36.7 C)     Temp Source Oral     SpO2 99 %     Weight 118 lb (53.5 kg)     Height 4\' 11"  (1.499 m)     Head Circumference      Peak Flow      Pain Score 9     Pain Loc      Pain Edu?      Excl. in GC?     Constitutional: Alert and oriented, in mild distress due to pain.  HEENT:      Head: Normocephalic and atraumatic.         Eyes: Conjunctivae are normal. Sclera is non-icteric. EOMI. PERRL      Mouth/Throat: Mucous membranes are moist.       Neck: Supple with no signs of meningismus. Cardiovascular: Tachycardic with regular rhythm. No murmurs, gallops, or rubs. 2+ symmetrical distal pulses are present in all extremities. No JVD. Respiratory: Normal respiratory effort. Lungs are clear to auscultation bilaterally. No wheezes, crackles, or rhonchi.  Gastrointestinal: Soft, ttp over the R lower pelvic region, non distended with positive bowel sounds. No rebound or guarding. Genitourinary: No CVA tenderness. Musculoskeletal: Nontender with normal range of motion in all  extremities. No edema, cyanosis, or erythema of extremities. Neurologic: Normal speech and language. Face is symmetric. Moving all extremities. No gross focal neurologic deficits are appreciated. Skin: Skin is warm, dry and intact. No rash noted. Psychiatric: Mood and affect are normal. Speech and behavior are normal.  ____________________________________________   LABS (all labs ordered are listed, but only abnormal results are displayed)  Labs Reviewed  URINE CULTURE  CBC WITH DIFFERENTIAL/PLATELET  COMPREHENSIVE METABOLIC PANEL  LIPASE, BLOOD  URINALYSIS COMPLETEWITH MICROSCOPIC ([facility] ONLY)  POC URINE PREG, ED   ____________________________________________  EKG  none ____________________________________________  RADIOLOGY  TVUS: Normal sonographic appearance of the right ovary. Arterial and venous waveforms are demonstrated. No sonographic evidence to suggest torsion.  Surgically absent uterus and left ovary. ____________________________________________   PROCEDURES  Procedure(s) performed: None Procedures Critical Care performed:  None ____________________________________________   INITIAL IMPRESSION / ASSESSMENT AND PLAN / ED COURSE  40 y.o. female h/o endometriosis and ovarian cyst who presents for evaluation of sudden onset of right lower pelvis pain radiating to her back. Patient is in mild distress, vital signs show tachycardia with heart rate of 101 otherwise within normal limits, she has tenderness to palpation on the right lower pelvis with no rebound or guarding. Patient status post hysterectomy. Differential diagnoses including ovarian torsion, ovarian cyst, appendicitis. Plan: IV fluids, IV morphine, IV Zofran; CBC, CMP, lipase, urinalysis, transvaginal ultrasound with doppler.   Clinical Course  Comment By Time  Blood work, urinalysis, and transvaginal ultrasound all with no acute findings. Patient reports that her pain is fully resolved. She is  tolerating by mouth. We'll discharge her with close follow-up with OB/GYN. Recommended patient returns if the pain comes back. Repeat abdominal exam showing no tenderness in a soft abdomen. Nita Sicklearolina Salem Lembke, MD 10/27 1129    Pertinent labs & imaging results that were available during my care of the patient were reviewed by me and considered in my medical decision making (see chart for details).    ____________________________________________   FINAL CLINICAL IMPRESSION(S) / ED DIAGNOSES  Final diagnoses:  RLQ abdominal pain  RLQ abdominal pain      NEW MEDICATIONS STARTED DURING THIS VISIT:  New Prescriptions   No medications on file     Note:  This document was prepared using Dragon voice recognition software and may include unintentional dictation errors.     Nita Sicklearolina Grenda Lora, MD 09/12/16 1131

## 2016-09-12 NOTE — ED Notes (Signed)
Pt transported to ultrasound.

## 2016-09-12 NOTE — ED Notes (Signed)
Pt returned from ultrasound

## 2016-09-12 NOTE — ED Triage Notes (Signed)
Pt with abd pain, n/v started this am.

## 2016-09-12 NOTE — Discharge Instructions (Signed)

## 2016-09-13 LAB — URINE CULTURE

## 2016-12-17 IMAGING — US US PELVIS COMPLETE
1 series · 14 of 25 positions shown · non-contrast
Comparison: None.

CLINICAL DATA: Patient with history of right lower quadrant pain.
Prior hysterectomy and left oophorectomy.

EXAM:
TRANSABDOMINAL ULTRASOUND OF PELVIS
DOPPLER ULTRASOUND OF OVARIES
TECHNIQUE: Transabdominal ultrasound examination of the pelvis was performed
including evaluation of the uterus, ovaries, adnexal regions, and
pelvic cul-de-sac.
Color and duplex Doppler ultrasound was utilized to evaluate blood
flow to the ovaries.

[Series 1: us pelvis complete · 0.21mm/px · 14 of 98 slices shown]
[im 1/98]
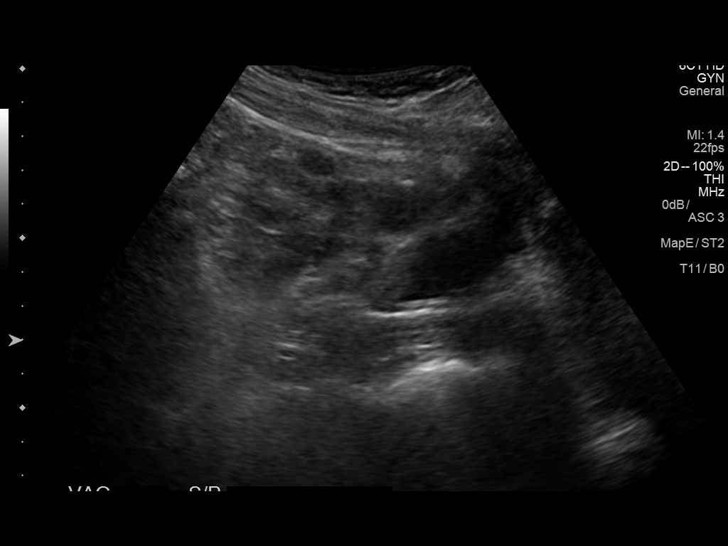
[im 9/98]
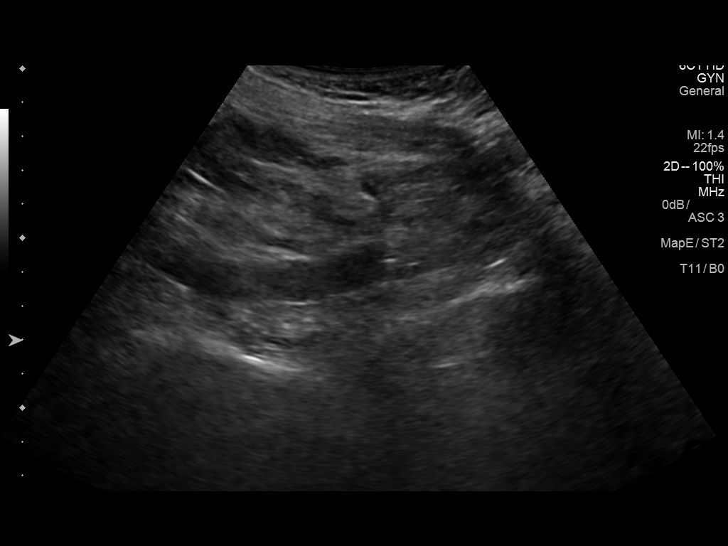
[im 17/98]
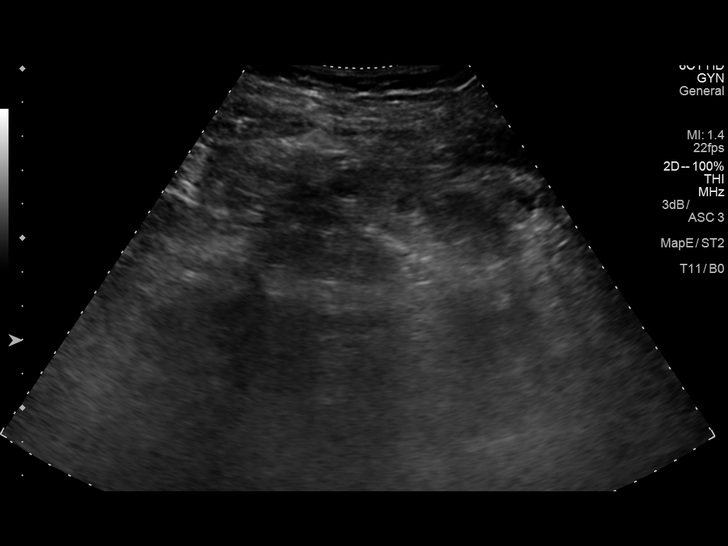
[im 25/98]
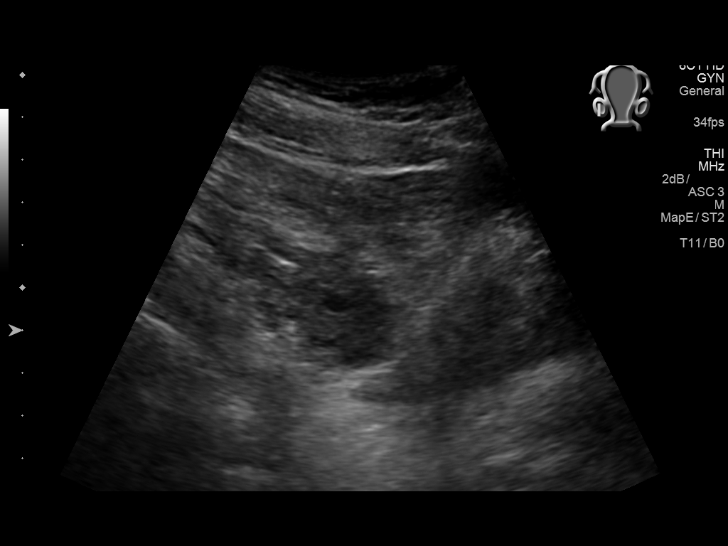
[im 33/98]
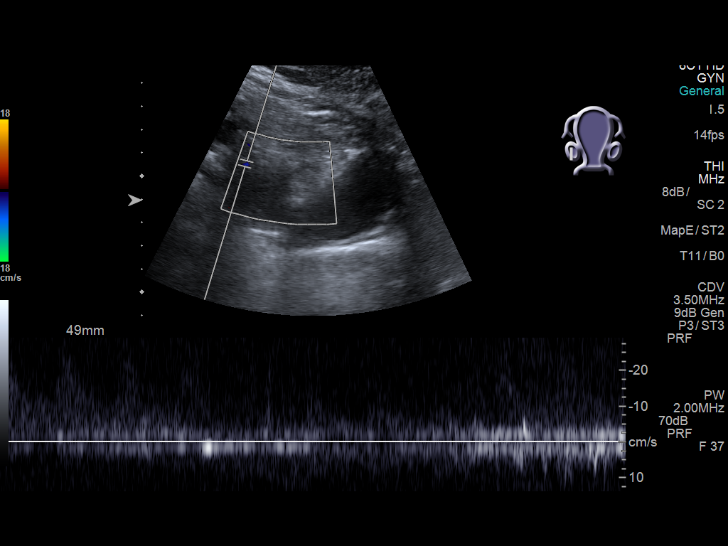
[im 37/98]
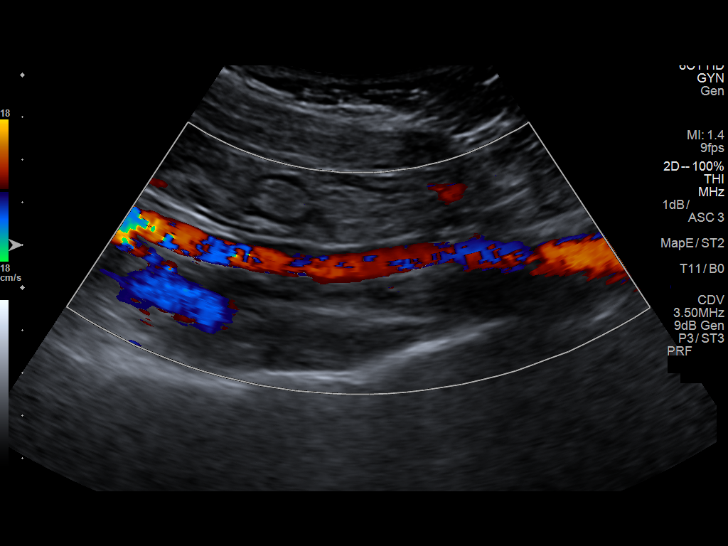
[im 45/98]
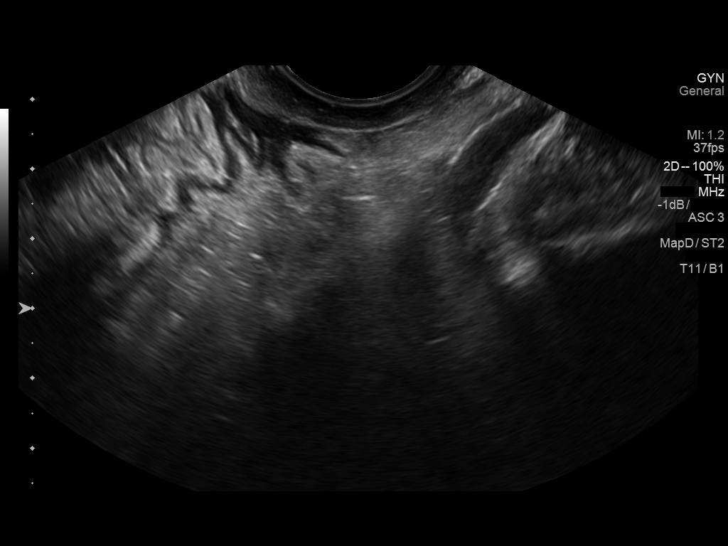
[im 53/98]
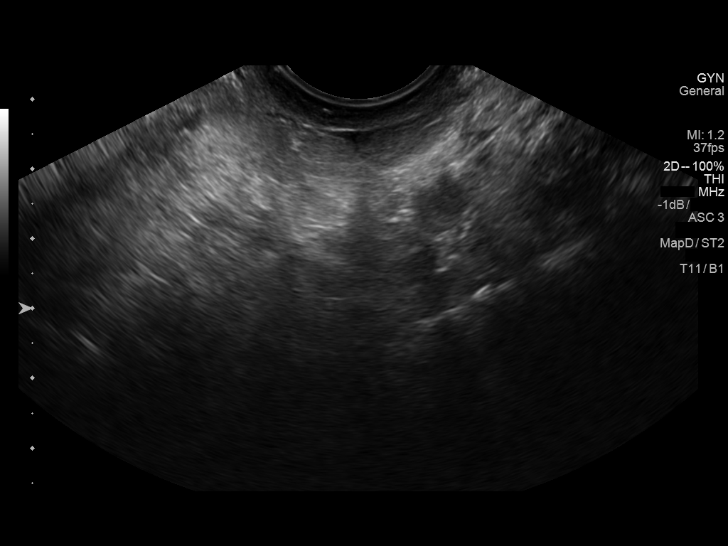
[im 61/98]
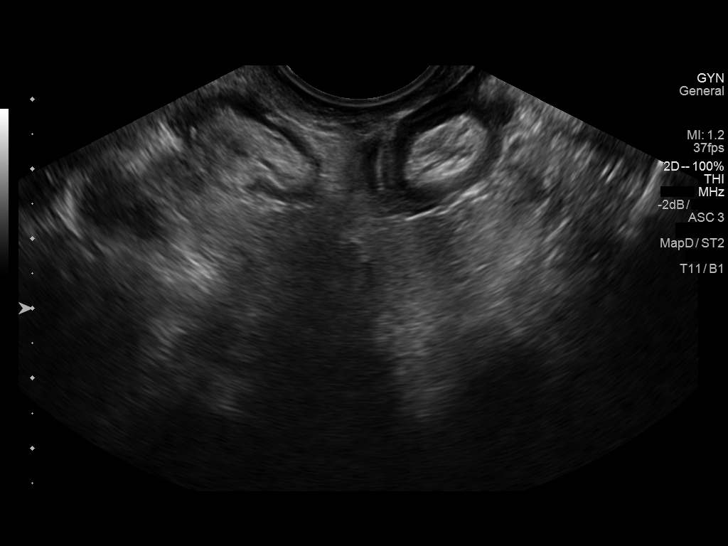
[im 65/98]
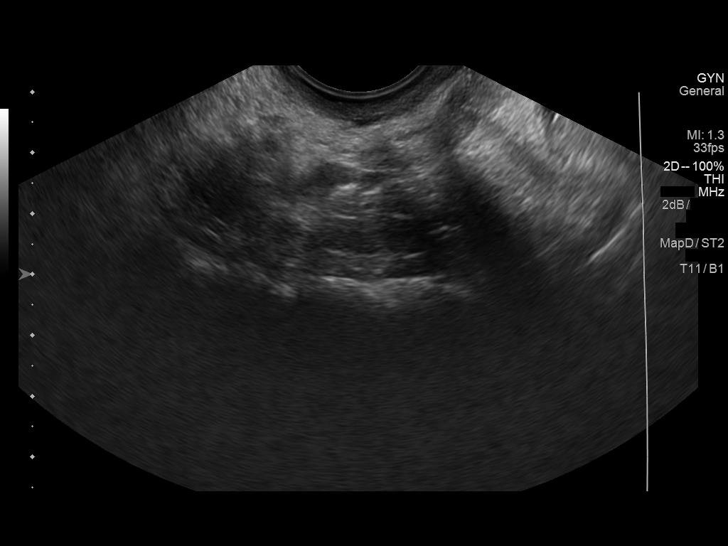
[im 73/98]
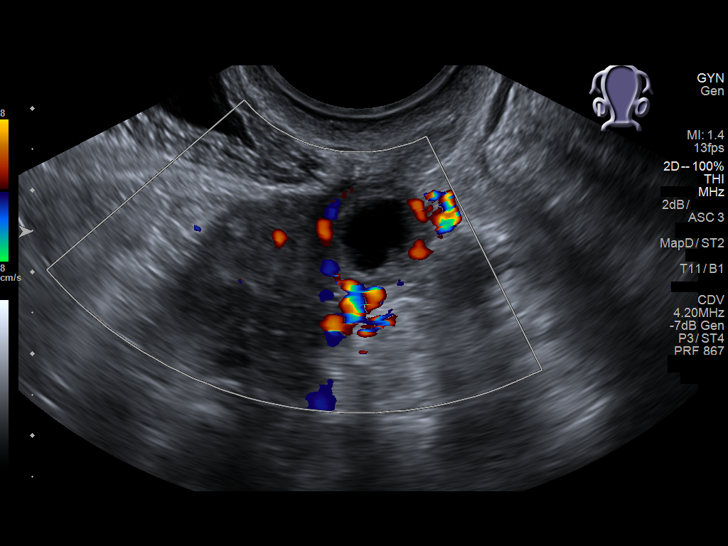
[im 81/98]
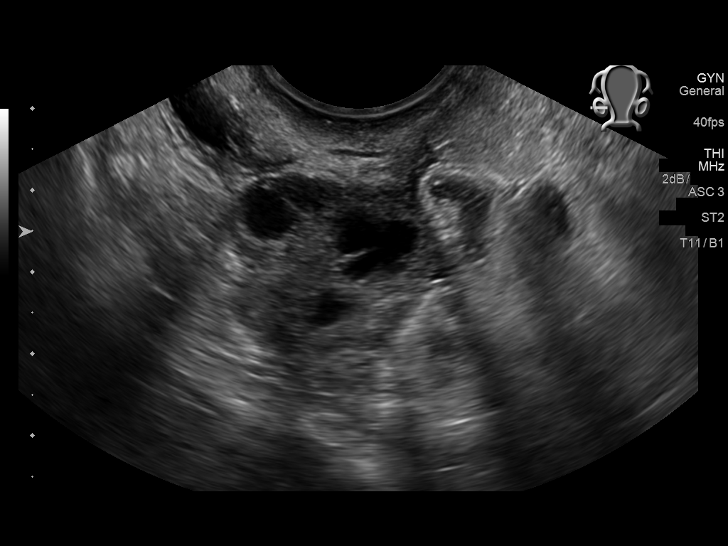
[im 89/98]
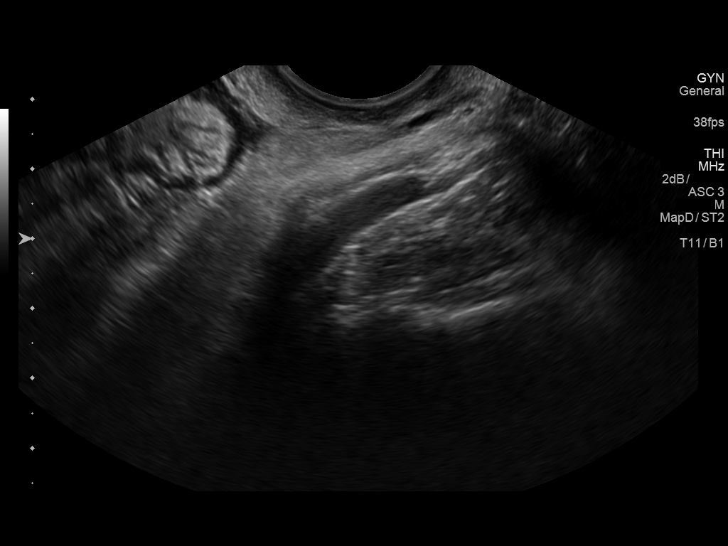
[im 98/98]
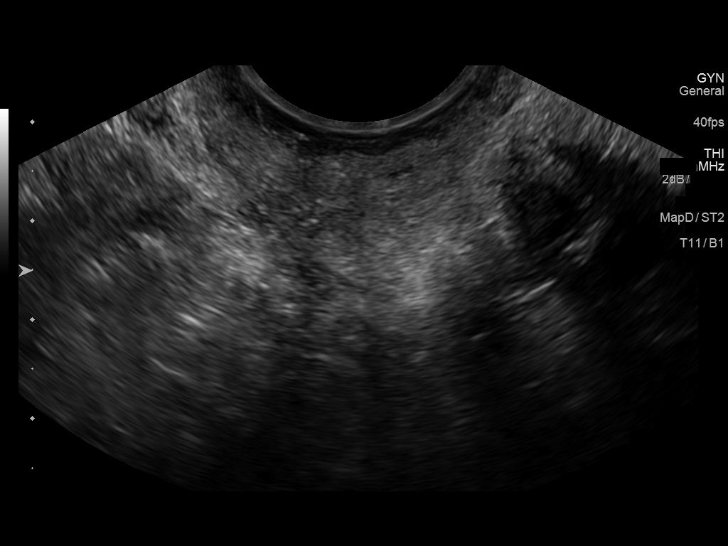

[14 of 25 positions shown; findings below may reference images not displayed]

FINDINGS: Uterus

Surgically absent uterus.

Right ovary

Measurements: 3.3 x 2.7 x 2.3 cm. Within the right ovary there is a
1.3 x 1.4 x 1.4 cm corpus luteum. Otherwise normal sonographic
appearance.

Left ovary

Surgically absent.

Pulsed Doppler evaluation demonstrates normal low-resistance
arterial and venous waveforms in the right ovary.
IMPRESSION: Normal sonographic appearance of the right ovary. Arterial and
venous waveforms are demonstrated. No sonographic evidence to
suggest torsion.

Surgically absent uterus and left ovary.

## 2017-02-22 ENCOUNTER — Emergency Department
Admission: EM | Admit: 2017-02-22 | Discharge: 2017-02-22 | Disposition: A | Discharge status: Home / Self Care | Payer: Medicaid Other | Attending: Emergency Medicine | Admitting: Emergency Medicine

## 2017-02-22 ENCOUNTER — Encounter: Payer: Self-pay | Admitting: Emergency Medicine

## 2017-02-22 DIAGNOSIS — R42 Dizziness and giddiness: Secondary | ICD-10-CM

## 2017-02-22 DIAGNOSIS — F17200 Nicotine dependence, unspecified, uncomplicated: Secondary | ICD-10-CM | POA: Insufficient documentation

## 2017-02-22 DIAGNOSIS — F419 Anxiety disorder, unspecified: Secondary | ICD-10-CM

## 2017-02-22 LAB — BASIC METABOLIC PANEL
Anion gap: 8 (ref 5–15)
BUN: 10 mg/dL (ref 6–20)
CHLORIDE: 103 mmol/L (ref 101–111)
CO2: 26 mmol/L (ref 22–32)
CREATININE: 1.19 mg/dL — AB (ref 0.44–1.00)
Calcium: 9.5 mg/dL (ref 8.9–10.3)
GFR calc non Af Amer: 56 mL/min — ABNORMAL LOW (ref >60)
Glucose, Bld: 116 mg/dL — ABNORMAL HIGH (ref 65–99)
POTASSIUM: 3.7 mmol/L (ref 3.5–5.1)
SODIUM: 137 mmol/L (ref 135–145)

## 2017-02-22 LAB — TROPONIN I: Troponin I: <0.03 ng/mL

## 2017-02-22 LAB — URINALYSIS, COMPLETE (UACMP) WITH MICROSCOPIC
Bilirubin Urine: NEGATIVE
Glucose, UA: NEGATIVE mg/dL
KETONES UR: NEGATIVE mg/dL
LEUKOCYTES UA: NEGATIVE
Nitrite: NEGATIVE
PROTEIN: NEGATIVE mg/dL
Specific Gravity, Urine: 1.008 (ref 1.005–1.030)
pH: 5 (ref 5.0–8.0)

## 2017-02-22 LAB — CBC
HCT: 46.5 % (ref 35.0–47.0)
Hemoglobin: 15.9 g/dL (ref 12.0–16.0)
MCH: 32.3 pg (ref 26.0–34.0)
MCHC: 34.2 g/dL (ref 32.0–36.0)
MCV: 94.3 fL (ref 80.0–100.0)
Platelets: 267 10*3/uL (ref 150–440)
RBC: 4.93 MIL/uL (ref 3.80–5.20)
RDW: 13.2 % (ref 11.5–14.5)
WBC: 9.5 10*3/uL (ref 3.6–11.0)

## 2017-02-22 NOTE — Discharge Instructions (Signed)
Please establish care with a primary care physician within one week for recheck. Return to the emergency department sooner for any concerns.  It was a pleasure to take care of you today, and thank you for coming to our emergency department.  If you have any questions or concerns before leaving please ask the nurse to grab me and I'm more than happy to go through your aftercare instructions again.  If you were prescribed any opioid pain medication today such as Norco, Vicodin, Percocet, morphine, hydrocodone, or oxycodone please make sure you do not drive when you are taking this medication as it can alter your ability to drive safely.  If you have any concerns once you are home that you are not improving or are in fact getting worse before you can make it to your follow-up appointment, please do not hesitate to call [phone] and come back for further evaluation.  Merrily Brittle MD  Results for orders placed or performed during the hospital encounter of 02/22/17  Basic metabolic panel  Result Value Ref Range   Sodium 137 135 - 145 mmol/L   Potassium 3.7 3.5 - 5.1 mmol/L   Chloride 103 101 - 111 mmol/L   CO2 26 22 - 32 mmol/L   Glucose, Bld 116 (H) 65 - 99 mg/dL   BUN 10 6 - 20 mg/dL   Creatinine, Ser 3.08 (H) 0.44 - 1.00 mg/dL   Calcium 9.5 8.9 - 65.7 mg/dL   GFR calc non Af Amer 56 (L) >60 mL/min   GFR calc Af Amer >60 >60 mL/min   Anion gap 8 5 - 15  CBC  Result Value Ref Range   WBC 9.5 3.6 - 11.0 K/uL   RBC 4.93 3.80 - 5.20 MIL/uL   Hemoglobin 15.9 12.0 - 16.0 g/dL   HCT 84.6 96.2 - 95.2 %   MCV 94.3 80.0 - 100.0 fL   MCH 32.3 26.0 - 34.0 pg   MCHC 34.2 32.0 - 36.0 g/dL   RDW 84.1 32.4 - 40.1 %   Platelets 267 150 - 440 K/uL  Urinalysis, Complete w Microscopic  Result Value Ref Range   Color, Urine YELLOW (A) YELLOW   APPearance CLEAR (A) CLEAR   Specific Gravity, Urine 1.008 1.005 - 1.030   pH 5.0 5.0 - 8.0   Glucose, UA NEGATIVE NEGATIVE mg/dL   Hgb urine dipstick SMALL (A)  NEGATIVE   Bilirubin Urine NEGATIVE NEGATIVE   Ketones, ur NEGATIVE NEGATIVE mg/dL   Protein, ur NEGATIVE NEGATIVE mg/dL   Nitrite NEGATIVE NEGATIVE   Leukocytes, UA NEGATIVE NEGATIVE   RBC / HPF 0-5 0 - 5 RBC/hpf   WBC, UA 0-5 0 - 5 WBC/hpf   Bacteria, UA RARE (A) NONE SEEN   Squamous Epithelial / LPF 0-5 (A) NONE SEEN  Troponin I  Result Value Ref Range   Troponin I <0.03 <0.03 ng/mL

## 2017-02-22 NOTE — ED Provider Notes (Signed)
[facility] Emergency Department Provider Note  ____________________________________________   First MD Initiated Contact with Patient 02/22/17 1836     (approximate)  I have reviewed the triage vital signs and the nursing notes.   HISTORY  Chief Complaint Dizziness    HPI [name] is a 41 y.o. female self presents to the emergency department with tingling in her hands and lightheadedness after having a difficult and stressful day at home. She has a long-standing history of anxiety disorder and normally takes 1 mg of alprazolam twice a day at home. By the time I saw her she said that she felt better thinks that her lightheadedness was secondary tohigh blood pressure which is now resolved. She denies vertigo. She denies chest pain shortness of breath or palpitations.   Past Medical History:  Diagnosis Date  . Anxiety   . Endometriosis   . Migraines   . Ovarian cyst   . Pregnancy induced hypertension     Patient Active Problem List   Diagnosis Date Noted  . 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 Psychotic disorder 06/10/2014    Past Surgical History:  Procedure Laterality Date  . ABDOMINAL HYSTERECTOMY     2009  . CESAREAN SECTION     1999  . LAPAROSCOPY     x5  . LEFT OOPHORECTOMY    . TUBAL LIGATION      Prior to Admission medications   Medication Sig Start Date End Date Taking? Authorizing Provider  ALPRAZolam Prudy Feeler) 1 MG tablet Take 1 mg by mouth 2 (two) times daily.    Historical Provider, MD  clonazePAM (KLONOPIN) 1 MG tablet Take 1 tablet by mouth 2 (two) times daily. 08/30/16   Historical Provider, MD  sertraline (ZOLOFT) 100 MG tablet Take 100 mg by mouth daily. 09/20/14   Historical Provider, MD    Allergies Antihistamines, diphenhydramine-type; Penicillins; and Sulfa antibiotics  Family History  Problem Relation Age of Onset  . Anesthesia problems Neg Hx   . Hypotension Neg Hx    . Malignant hyperthermia Neg Hx   . Pseudochol deficiency Neg Hx     Social History Social History  Substance Use Topics  . Smoking status: Current Some Day Smoker    Packs/day: 0.50  . Smokeless tobacco: Never Used  . Alcohol use No    Review of Systems Constitutional: No fever/chills Eyes: No visual changes. ENT: No sore throat. Cardiovascular: Denies chest pain. Respiratory: Denies shortness of breath. Gastrointestinal: No abdominal pain.  No nausea, no vomiting.  No diarrhea.  No constipation. Genitourinary: Negative for dysuria. Musculoskeletal: Negative for back pain. Skin: Negative for rash. Neurological: Negative for headaches  10-point ROS otherwise negative.  ____________________________________________   PHYSICAL EXAM:  VITAL SIGNS: ED Triage Vitals  Enc Vitals Group     BP 02/22/17 1617 (!) 150/90     Pulse Rate 02/22/17 1617 (!) 110     Resp 02/22/17 1753 15     Temp 02/22/17 1617 98.7 F (37.1 C)     Temp Source 02/22/17 1617 Oral     SpO2 02/22/17 1617 99 %     Weight 02/22/17 1618 115 lb (52.2 kg)     Height 02/22/17 1618  (1.499 m)     Head Circumference --      Peak Flow --      Pain Score 02/22/17 1624 4     Pain Loc --      Pain Edu? --  Excl. in GC? --     Constitutional: Alert and oriented x 4 well appearing nontoxic no diaphoresis speaks in full, clear sentences Eyes: PERRL EOMI.No nystagmus Head: Atraumatic. Nose: No congestion/rhinnorhea. Mouth/Throat: No trismus Neck: No stridor.   Cardiovascular: Normal rate, regular rhythm. Grossly normal heart sounds.  Good peripheral circulation. Respiratory: Normal respiratory effort.  No retractions. Lungs CTAB and moving good air Gastrointestinal: Soft nondistended nontender no rebound no guarding no peritonitis no McBurney's tenderness negative Rovsing's no costovertebral tenderness negative Murphy's Musculoskeletal: No lower extremity edema   Neurologic:  Normal speech and  language. No gross focal neurologic deficits are appreciated. Skin:  Skin is warm, dry and intact. No rash noted. Psychiatric: Mood and affect are normal. Speech and behavior are normal.    ____________________________________________  ____________________________________________   LABS (all labs ordered are listed, but only abnormal results are displayed)  Labs Reviewed  BASIC METABOLIC PANEL - Abnormal; Notable for the following:       Result Value   Glucose, Bld 116 (*)    Creatinine, Ser 1.19 (*)    GFR calc non Af Amer 56 (*)    All other components within normal limits  URINALYSIS, COMPLETE (UACMP) WITH MICROSCOPIC - Abnormal; Notable for the following:    Color, Urine YELLOW (*)    APPearance CLEAR (*)    Hgb urine dipstick SMALL (*)    Bacteria, UA RARE (*)    Squamous Epithelial / LPF 0-5 (*)    All other components within normal limits  CBC  TROPONIN I    Labs are unremarkable __________________________________________  EKG  ED ECG REPORT I, Merrily Brittle, the attending physician, personally viewed and interpreted this ECG.  Date: 02/22/2017 Rate: 104 Rhythm: Sinus tachycardia QRS Axis: normal Intervals: normal ST/T Wave abnormalities: normal Conduction Disturbances: none Narrative Interpretation: Borderline  ____________________________________________  RADIOLOGY   ____________________________________________   PROCEDURES  Procedure(s) performed: no  Procedures  Critical Care performed: no  ____________________________________________   INITIAL IMPRESSION / ASSESSMENT AND PLAN / ED COURSE  Pertinent labs & imaging results that were available during my care of the patient were reviewed by me and considered in my medical decision making (see chart for details).  By the time I saw the patient she had been in the department for 2-1/2 hours and artery had labs are unremarkable and she felt improved. She said that when she becomes anxious  she hyperventilates neck causes her hands to tingle which I think is right. At this point I'm comfortable discharging her home with primary care follow-up as scheduled. She understands and agrees the plan.      ____________________________________________   FINAL CLINICAL IMPRESSION(S) / ED DIAGNOSES  Final diagnoses:  Lightheaded  Anxiety      NEW MEDICATIONS STARTED DURING THIS VISIT:  Discharge Medication List as of 02/22/2017  7:10 PM       Note:  This document was prepared using Dragon voice recognition software and may include unintentional dictation errors.     Merrily Brittle, MD 02/22/17 [phone]

## 2017-02-22 NOTE — ED Triage Notes (Signed)
Pt presents to ED via POV with c/o dizziness. Pt states tried to rest this afternoon without success. Pt also c/o mild headache at this time. Pt is alert and oriented, neurologically intact at this time.

## 2017-04-06 ENCOUNTER — Encounter: Payer: Self-pay | Admitting: Emergency Medicine

## 2017-04-06 DIAGNOSIS — F17200 Nicotine dependence, unspecified, uncomplicated: Secondary | ICD-10-CM | POA: Insufficient documentation

## 2017-04-06 DIAGNOSIS — R102 Pelvic and perineal pain: Secondary | ICD-10-CM | POA: Insufficient documentation

## 2017-04-06 DIAGNOSIS — Z5321 Procedure and treatment not carried out due to patient leaving prior to being seen by health care provider: Secondary | ICD-10-CM | POA: Insufficient documentation

## 2017-04-06 LAB — URINALYSIS, COMPLETE (UACMP) WITH MICROSCOPIC
BILIRUBIN URINE: NEGATIVE
Bacteria, UA: NONE SEEN
Glucose, UA: NEGATIVE mg/dL
Hgb urine dipstick: NEGATIVE
Ketones, ur: NEGATIVE mg/dL
Leukocytes, UA: NEGATIVE
Nitrite: NEGATIVE
Protein, ur: NEGATIVE mg/dL
SPECIFIC GRAVITY, URINE: 1.005 (ref 1.005–1.030)
pH: 6 (ref 5.0–8.0)

## 2017-04-06 LAB — CBC
HCT: 40.8 % (ref 35.0–47.0)
Hemoglobin: 14.1 g/dL (ref 12.0–16.0)
MCH: 32.2 pg (ref 26.0–34.0)
MCHC: 34.5 g/dL (ref 32.0–36.0)
MCV: 93.4 fL (ref 80.0–100.0)
PLATELETS: 247 10*3/uL (ref 150–440)
RBC: 4.37 MIL/uL (ref 3.80–5.20)
RDW: 12.9 % (ref 11.5–14.5)
WBC: 9.9 10*3/uL (ref 3.6–11.0)

## 2017-04-06 LAB — COMPREHENSIVE METABOLIC PANEL
ALBUMIN: 4.3 g/dL (ref 3.5–5.0)
ALK PHOS: 53 U/L (ref 38–126)
ALT: 21 U/L (ref 14–54)
AST: 24 U/L (ref 15–41)
Anion gap: 7 (ref 5–15)
BUN: 13 mg/dL (ref 6–20)
CALCIUM: 9.4 mg/dL (ref 8.9–10.3)
CHLORIDE: 105 mmol/L (ref 101–111)
CO2: 26 mmol/L (ref 22–32)
CREATININE: 1.17 mg/dL — AB (ref 0.44–1.00)
GFR calc Af Amer: >60 mL/min (ref >60)
GFR calc non Af Amer: 57 mL/min — ABNORMAL LOW (ref >60)
Glucose, Bld: 93 mg/dL (ref 65–99)
Potassium: 3.3 mmol/L — ABNORMAL LOW (ref 3.5–5.1)
SODIUM: 138 mmol/L (ref 135–145)
Total Bilirubin: 0.8 mg/dL (ref 0.3–1.2)
Total Protein: 7.1 g/dL (ref 6.5–8.1)

## 2017-04-06 NOTE — ED Notes (Signed)
Error in charting : urine collected as clean catch

## 2017-04-06 NOTE — ED Triage Notes (Signed)
Pt presents with visible low abdominal swelling and severe pain since Wednesday with known hx of ovarian cyst. Pain is constant , nagging pain 10/10.

## 2017-04-06 NOTE — ED Notes (Signed)
Spoke with Dr. Lenard LancePaduchowski for questionable imaging . No further imaging order at present time

## 2017-04-07 ENCOUNTER — Emergency Department
Admission: EM | Admit: 2017-04-07 | Discharge: 2017-04-07 | Disposition: A | Discharge status: Home / Self Care | Payer: Medicaid Other | Attending: Emergency Medicine | Admitting: Emergency Medicine

## 2017-04-07 ENCOUNTER — Telehealth: Payer: Self-pay | Admitting: Emergency Medicine

## 2017-04-07 NOTE — Telephone Encounter (Signed)
Called patient due to lwot to inquire about condition and follow up plans. Left message.   

## 2017-09-01 IMAGING — CR DG FINGER INDEX 2+V*R*
1 series · 3 of 3 positions shown · non-contrast
Comparison: None.

CLINICAL DATA: Acute onset right index finger pain, after hitting
it on countertop. Initial encounter.

EXAM:
RIGHT INDEX FINGER 2+V

[Series 1: dg finger index right · 0.14mm/px · 3 of 3 slices shown]
[im 1/3]
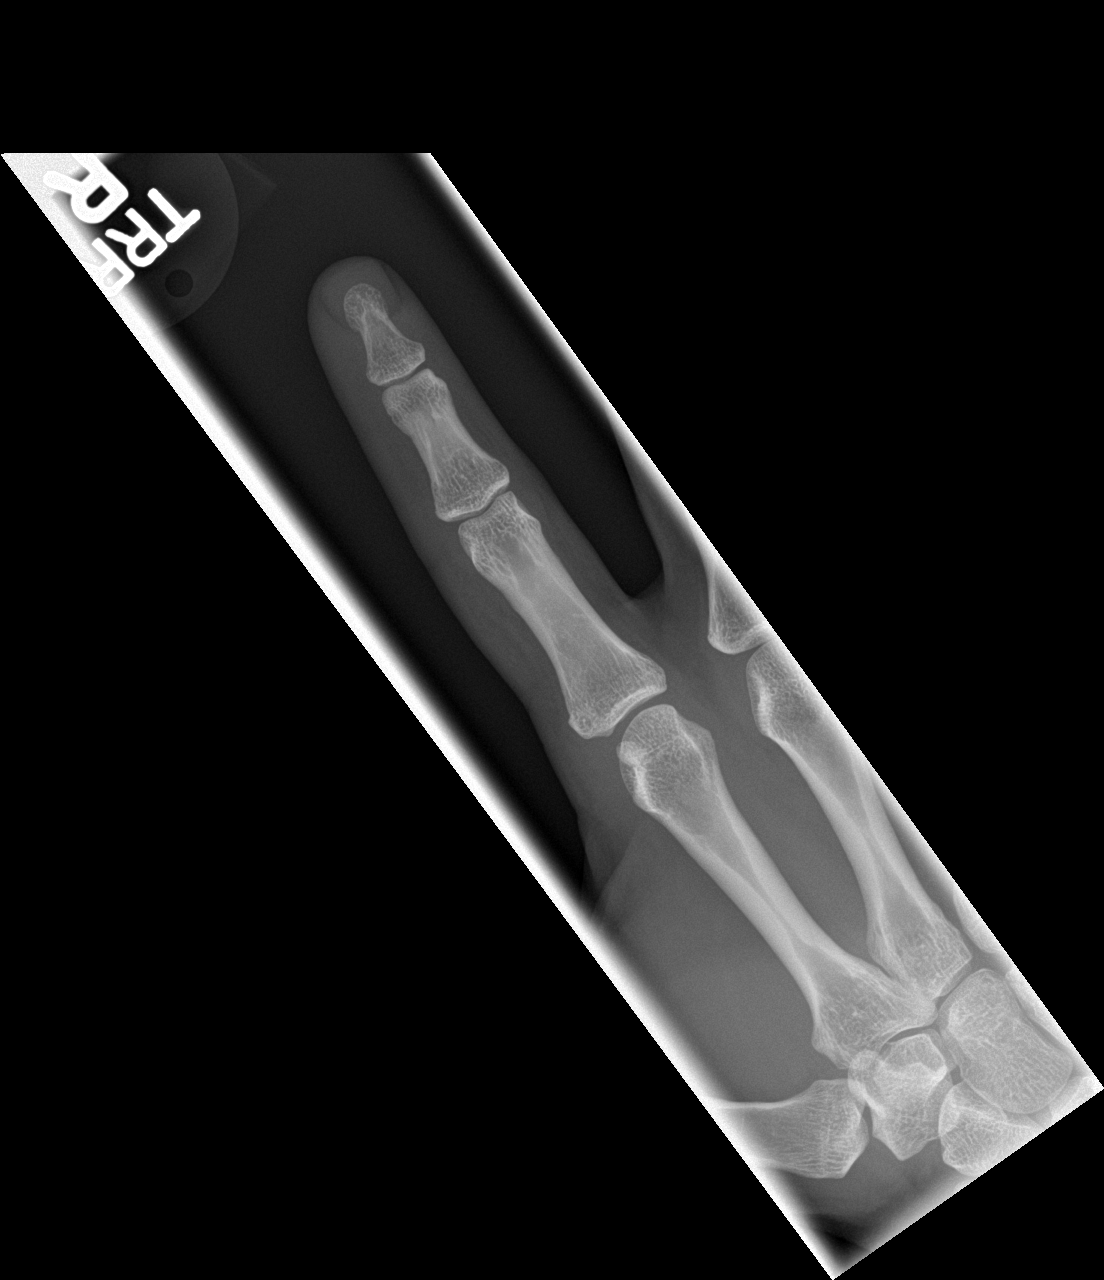
[im 2/3]
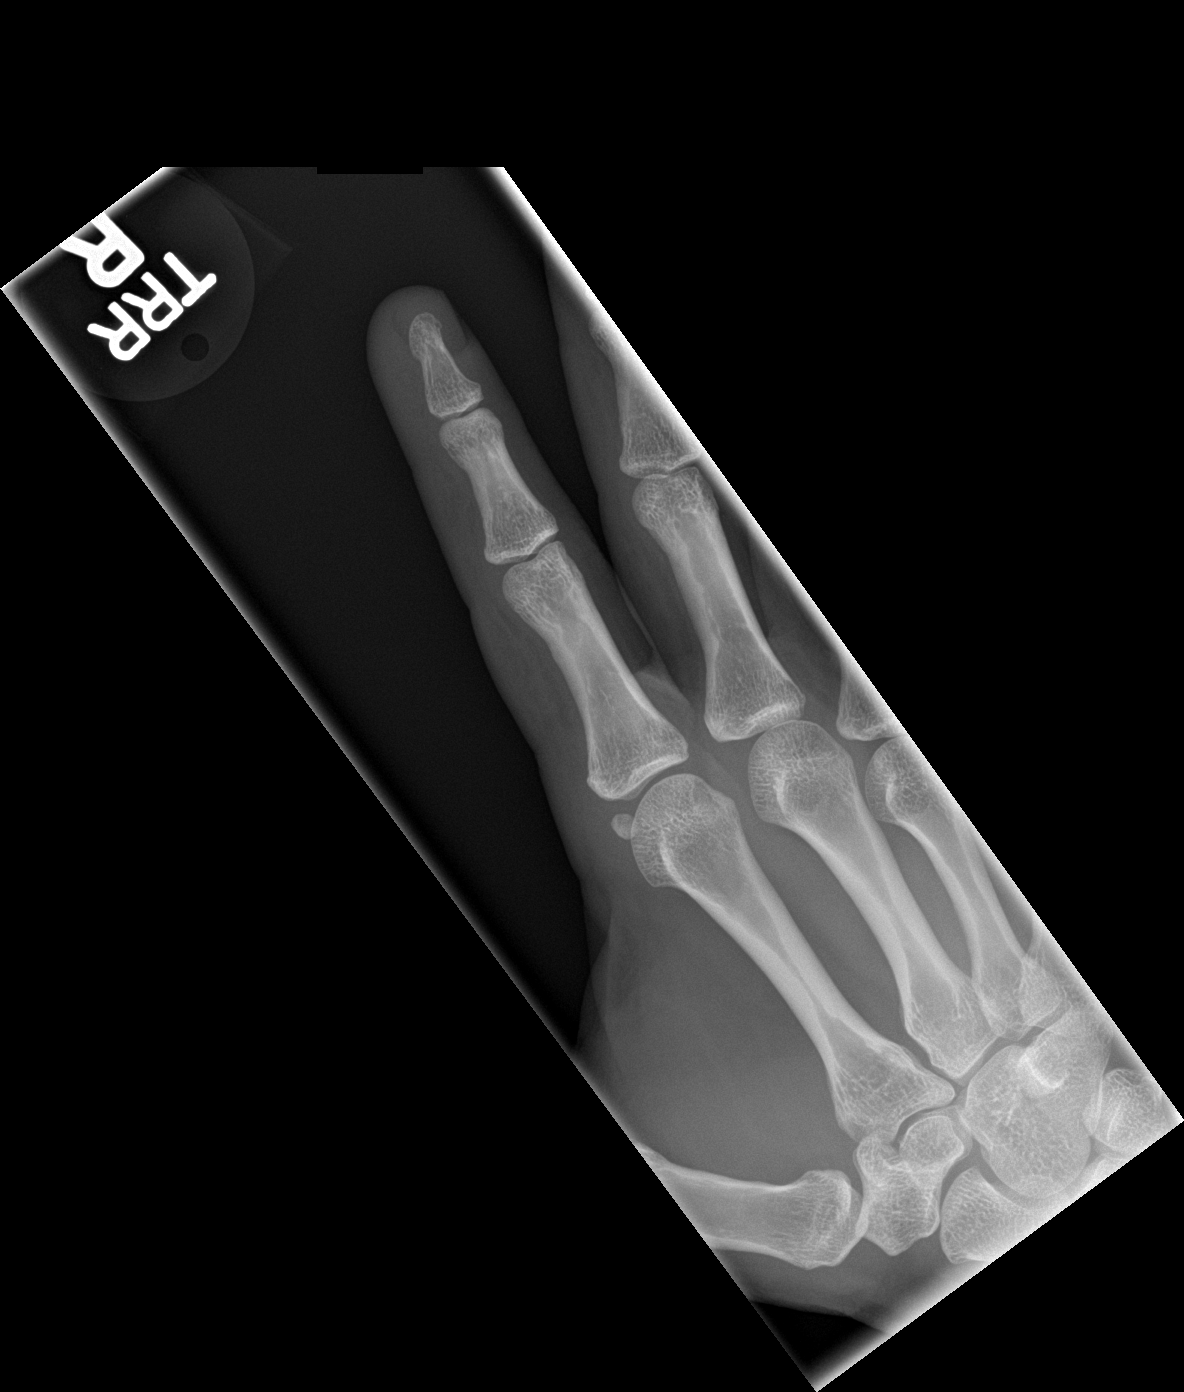
[im 3/3]
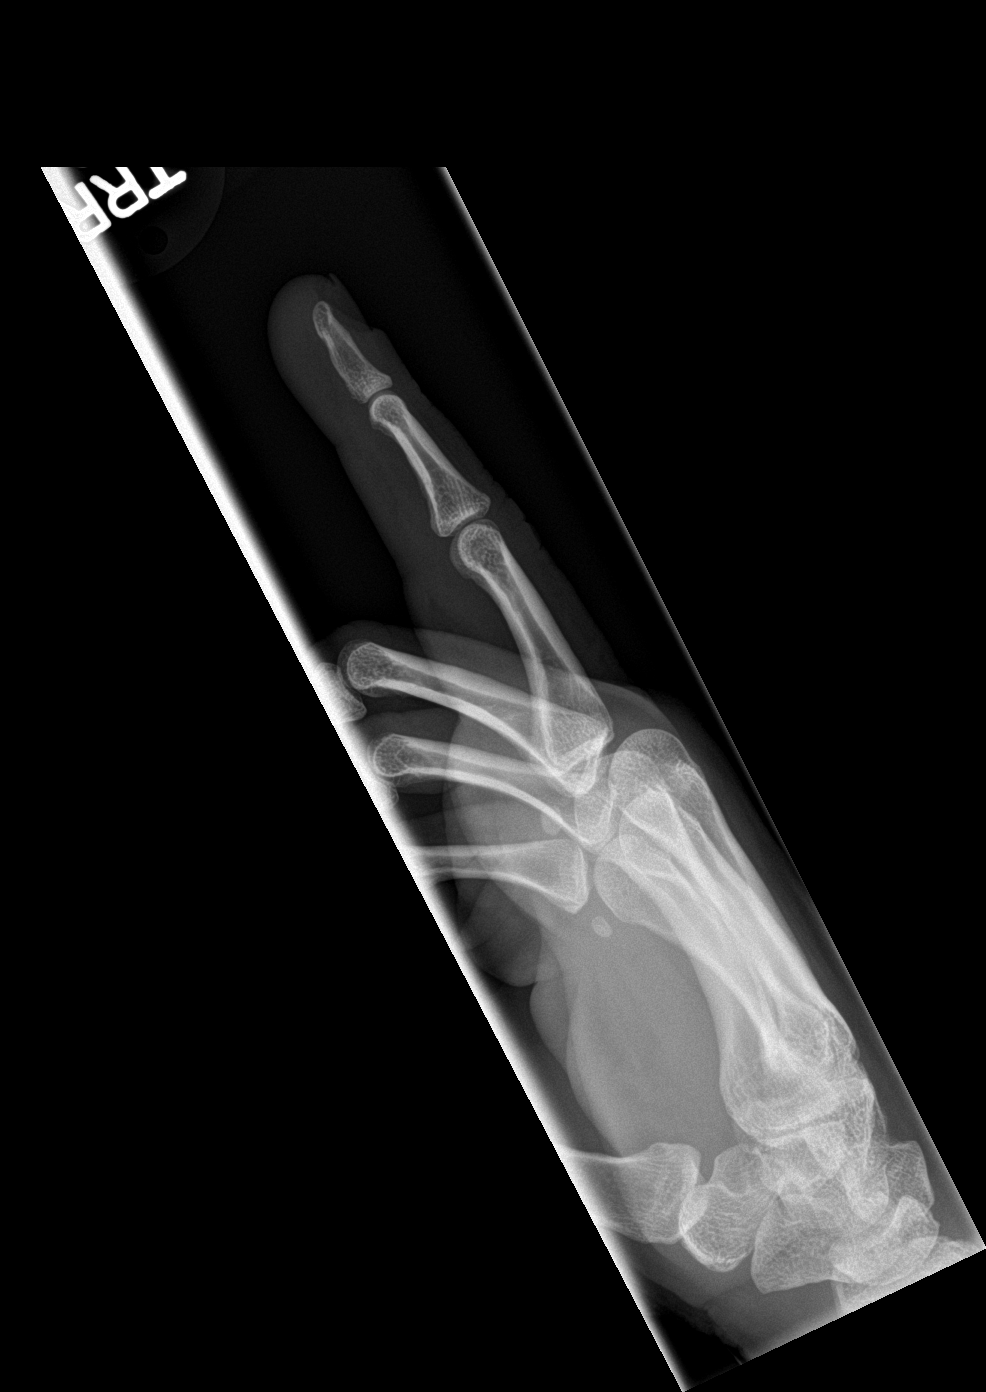

[3 of 3 positions shown; findings below may reference images not displayed]

FINDINGS: There is no evidence of fracture or dislocation. Visualized joint
spaces are preserved. No definite soft tissue abnormalities are
characterized.
IMPRESSION: No evidence of fracture or dislocation.

## 2018-02-11 ENCOUNTER — Emergency Department
Admission: EM | Admit: 2018-02-11 | Discharge: 2018-02-11 | Disposition: A | Discharge status: Home / Self Care | Payer: 59 | Attending: Student in an Organized Health Care Education/Training Program | Admitting: Student in an Organized Health Care Education/Training Program

## 2018-02-11 DIAGNOSIS — N390 Urinary tract infection, site not specified: Secondary | ICD-10-CM

## 2018-02-11 DIAGNOSIS — F17210 Nicotine dependence, cigarettes, uncomplicated: Secondary | ICD-10-CM | POA: Diagnosis not present

## 2018-02-11 DIAGNOSIS — R350 Frequency of micturition: Secondary | ICD-10-CM | POA: Diagnosis present

## 2018-02-11 DIAGNOSIS — R3915 Urgency of urination: Secondary | ICD-10-CM | POA: Insufficient documentation

## 2018-02-11 DIAGNOSIS — R300 Dysuria: Secondary | ICD-10-CM | POA: Diagnosis not present

## 2018-02-11 DIAGNOSIS — Z79899 Other long term (current) drug therapy: Secondary | ICD-10-CM | POA: Diagnosis not present

## 2018-02-11 LAB — URINALYSIS, COMPLETE (UACMP) WITH MICROSCOPIC
BILIRUBIN URINE: NEGATIVE
GLUCOSE, UA: NEGATIVE mg/dL
Hgb urine dipstick: NEGATIVE
KETONES UR: NEGATIVE mg/dL
Nitrite: NEGATIVE
PH: 6 (ref 5.0–8.0)
PROTEIN: NEGATIVE mg/dL
Specific Gravity, Urine: 1.011 (ref 1.005–1.030)

## 2018-02-11 MED ORDER — NITROFURANTOIN MONOHYD MACRO 100 MG PO CAPS: 100.0000 mg | ORAL_CAPSULE | Freq: Two times a day (BID) | ORAL | 0 refills | Status: AC

## 2018-02-11 MED ORDER — NITROFURANTOIN MONOHYD MACRO 100 MG PO CAPS: 100.0000 mg | ORAL_CAPSULE | Freq: Two times a day (BID) | ORAL | 0 refills | Status: DC

## 2018-02-11 NOTE — ED Triage Notes (Signed)
Patient c/o increased urinary frequency, dysuria, and malodorous urine

## 2018-02-11 NOTE — ED Provider Notes (Signed)
[facility] Emergency Department Provider Note  ____________________________________________  Time seen: Approximately 11:36 PM  I have reviewed the triage vital signs and the nursing notes.   HISTORY  Chief Complaint Urinary Frequency    HPI [name] is a 42 y.o. female that presents emergency department for evaluation of dysuria, urgency for 3 days.  Patient states that in the morning her urine has a foul smell.  She states that the only antibiotic that works for her is Macrobid. She denies fever, chills, nausea, vomiting, abdominal pain, back pain, hematuria..   Past Medical History:  Diagnosis Date  . Anxiety   . Endometriosis   . Migraines   . Ovarian cyst   . Pregnancy induced hypertension     Patient Active Problem List   Diagnosis Date Noted  . 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 Psychotic disorder (HCC) 06/10/2014    Past Surgical History:  Procedure Laterality Date  . ABDOMINAL HYSTERECTOMY     2009  . CESAREAN SECTION     1999  . LAPAROSCOPY     x5  . LEFT OOPHORECTOMY    . TUBAL LIGATION      Prior to Admission medications   Medication Sig Start Date End Date Taking? Authorizing Provider  ALPRAZolam Prudy Feeler) 1 MG tablet Take 1 mg by mouth 2 (two) times daily.    [provider]  clonazePAM (KLONOPIN) 1 MG tablet Take 1 tablet by mouth 2 (two) times daily. 08/30/16   [provider]  nitrofurantoin, macrocrystal-monohydrate, (MACROBID) 100 MG capsule Take 1 capsule (100 mg total) by mouth 2 (two) times daily for 7 days. 02/11/18 02/18/18  Enid Derry, PA-C  sertraline (ZOLOFT) 100 MG tablet Take 100 mg by mouth daily. 09/20/14   [provider]    Allergies Antihistamines, diphenhydramine-type; Penicillins; and Sulfa antibiotics  Family History  Problem Relation Age of Onset  . Anesthesia problems Neg Hx   . Hypotension Neg Hx   . Malignant  hyperthermia Neg Hx   . Pseudochol deficiency Neg Hx     Social History Social History   Tobacco Use  . Smoking status: Current Some Day Smoker    Packs/day: 0.50  . Smokeless tobacco: Never Used  Substance Use Topics  . Alcohol use: No  . Drug use: No     Review of Systems  Constitutional: No fever/chills Cardiovascular: No chest pain. Respiratory: No SOB. Gastrointestinal: No abdominal pain.  No nausea, no vomiting.  Musculoskeletal: Negative for musculoskeletal pain. Skin: Negative for rash, abrasions, lacerations, ecchymosis.   ____________________________________________   PHYSICAL EXAM:  VITAL SIGNS: ED Triage Vitals [02/11/18 2204]  Enc Vitals Group     BP 113/78     Pulse Rate 92     Resp 17     Temp 98.7 F (37.1 C)     Temp Source Oral     SpO2 100 %     Weight 115 lb (52.2 kg)     Height      Head Circumference      Peak Flow      Pain Score 0     Pain Loc      Pain Edu?      Excl. in GC?      Constitutional: Alert and oriented. Well appearing and in no acute distress. Eyes: Conjunctivae are normal. PERRL. EOMI. Head: Atraumatic. ENT:      Ears:      Nose: No congestion/rhinnorhea.  Mouth/Throat: Mucous membranes are moist.  Neck: No stridor.   Cardiovascular: Normal rate, regular rhythm.  Good peripheral circulation. Respiratory: Normal respiratory effort without tachypnea or retractions. Lungs CTAB. Good air entry to the bases with no decreased or absent breath sounds. Gastrointestinal: Bowel sounds 4 quadrants. Soft and nontender to palpation. No guarding or rigidity. No palpable masses. No distention. No CVA tenderness. Musculoskeletal: Full range of motion to all extremities. No gross deformities appreciated. Neurologic:  Normal speech and language. No gross focal neurologic deficits are appreciated.  Skin:  Skin is warm, dry and intact. No rash noted.   ____________________________________________   LABS (all labs ordered  are listed, but only abnormal results are displayed)  Labs Reviewed  URINALYSIS, COMPLETE (UACMP) WITH MICROSCOPIC - Abnormal; Notable for the following components:      Result Value   Color, Urine YELLOW (*)    APPearance HAZY (*)    Leukocytes, UA LARGE (*)    Bacteria, UA FEW (*)    Squamous Epithelial / LPF 0-5 (*)    All other components within normal limits   ____________________________________________  EKG   ____________________________________________  RADIOLOGY   No results found.  ____________________________________________    PROCEDURES  Procedure(s) performed:    Procedures    Medications - No data to display   ____________________________________________   INITIAL IMPRESSION / ASSESSMENT AND PLAN / ED COURSE  Pertinent labs & imaging results that were available during my care of the patient were reviewed by me and considered in my medical decision making (see chart for details).  Review of the [place] CSRS was performed in accordance of the [facility] prior to dispensing any controlled drugs.     Patient's diagnosis is consistent with urinary tract infection.  Vital signs and exam are reassuring.  Symptoms and urinalysis are consistent with infection. Patient states that the only antibiotic that has worked for her in the past is Macrobid. Patient will be discharged home with prescriptions for Macrobid. Patient is to follow up with PCP as directed. Patient is given ED precautions to return to the ED for any worsening or new symptoms.     ____________________________________________  FINAL CLINICAL IMPRESSION(S) / ED DIAGNOSES  Final diagnoses:  Lower urinary tract infectious disease      NEW MEDICATIONS STARTED DURING THIS VISIT:  ED Discharge Orders        Ordered    nitrofurantoin, macrocrystal-monohydrate, (MACROBID) 100 MG capsule  2 times daily,   Status:  Discontinued     02/11/18 2339    nitrofurantoin, macrocrystal-monohydrate,  (MACROBID) 100 MG capsule  2 times daily     02/11/18 2341          This chart was dictated using voice recognition software/Dragon. Despite best efforts to proofread, errors can occur which can change the meaning. Any change was purely unintentional.    Enid DerryWagner, Raquelle Pietro, PA-C 02/12/18 0016    Willy Eddyobinson, Patrick, MD 02/15/18 1355

## 2018-11-06 ENCOUNTER — Encounter (HOSPITAL_COMMUNITY): Payer: Self-pay | Admitting: *Deleted

## 2018-11-06 ENCOUNTER — Inpatient Hospital Stay (HOSPITAL_COMMUNITY)
Admission: AD | Admit: 2018-11-06 | Discharge: 2018-11-07 | Disposition: A | Discharge status: Home / Self Care | Payer: BLUE CROSS/BLUE SHIELD | Source: Ambulatory Visit | Attending: Obstetrics and Gynecology | Admitting: Obstetrics and Gynecology

## 2018-11-06 DIAGNOSIS — Z90710 Acquired absence of both cervix and uterus: Secondary | ICD-10-CM | POA: Diagnosis not present

## 2018-11-06 DIAGNOSIS — G8929 Other chronic pain: Secondary | ICD-10-CM | POA: Insufficient documentation

## 2018-11-06 DIAGNOSIS — F17210 Nicotine dependence, cigarettes, uncomplicated: Secondary | ICD-10-CM | POA: Diagnosis not present

## 2018-11-06 DIAGNOSIS — F419 Anxiety disorder, unspecified: Secondary | ICD-10-CM | POA: Insufficient documentation

## 2018-11-06 DIAGNOSIS — R109 Unspecified abdominal pain: Secondary | ICD-10-CM

## 2018-11-06 DIAGNOSIS — R1031 Right lower quadrant pain: Secondary | ICD-10-CM | POA: Insufficient documentation

## 2018-11-06 DIAGNOSIS — Z79899 Other long term (current) drug therapy: Secondary | ICD-10-CM | POA: Insufficient documentation

## 2018-11-06 LAB — CBC WITH DIFFERENTIAL/PLATELET
BASOS ABS: 0 10*3/uL (ref 0.0–0.1)
BASOS PCT: 0 %
Eosinophils Absolute: 0.2 10*3/uL (ref 0.0–0.5)
Eosinophils Relative: 2 %
HEMATOCRIT: 39.4 % (ref 36.0–46.0)
HEMOGLOBIN: 13.3 g/dL (ref 12.0–15.0)
Lymphocytes Relative: 29 %
Lymphs Abs: 2.8 10*3/uL (ref 0.7–4.0)
MCH: 31.8 pg (ref 26.0–34.0)
MCHC: 33.8 g/dL (ref 30.0–36.0)
MCV: 94.3 fL (ref 80.0–100.0)
MONOS PCT: 4 %
Monocytes Absolute: 0.4 10*3/uL (ref 0.1–1.0)
NEUTROS ABS: 6.2 10*3/uL (ref 1.7–7.7)
NEUTROS PCT: 65 %
NRBC: 0 % (ref 0.0–0.2)
Platelets: 278 10*3/uL (ref 150–400)
RBC: 4.18 MIL/uL (ref 3.87–5.11)
RDW: 12.9 % (ref 11.5–15.5)
WBC: 9.7 10*3/uL (ref 4.0–10.5)

## 2018-11-06 LAB — URINALYSIS, ROUTINE W REFLEX MICROSCOPIC
Bilirubin Urine: NEGATIVE
GLUCOSE, UA: NEGATIVE mg/dL
Hgb urine dipstick: NEGATIVE
Ketones, ur: NEGATIVE mg/dL
LEUKOCYTES UA: NEGATIVE
NITRITE: NEGATIVE
Protein, ur: NEGATIVE mg/dL
Specific Gravity, Urine: 1.016 (ref 1.005–1.030)
pH: 5 (ref 5.0–8.0)

## 2018-11-06 MED ORDER — KETOROLAC TROMETHAMINE 60 MG/2ML IM SOLN
60.0000 mg | Freq: Once | INTRAMUSCULAR | Status: AC
  Administered 2018-11-06: 60 mg via INTRAMUSCULAR
  Filled 2018-11-06: qty 2

## 2018-11-06 NOTE — MAU Note (Signed)
Had hysterectomy about 222yrs ago. Still have R ovary. Have not seen Dr Rana SnareLowe in about 2 yrs. On occ get cyst on R ovary and my abdomen swells and then cyst bursts.  Have pain R side of abd around to back. Today pain has really been bad but started first of wk. Pain meds helped then. Have taken oxycodone and 2 Goody powders and nothing helps. No vag bleeding or d/c.

## 2018-11-06 NOTE — MAU Provider Note (Signed)
History     CSN: [id number]  Arrival date and time: 11/06/18 2143   First Provider Initiated Contact with Patient 11/06/18 2258      Chief Complaint  Patient presents with  . Ovarian Cyst   HPI   Ms.[name] is a 42 y.o. female [phone] non pregnant here with right lower quadrant pain. The pain started today. She took 1 hydrocodone which helped some. No fever. History of hysterectomy back in 2009; spared right ovary.  The pain is consistent with the pain she has had in the past with ovarian cysts. The pain is constant. No vaginal bleeding or vaginal pain   OB History    Gravida  2   Para  2   Term  1   Preterm  1   AB      Living  2     SAB      TAB      Ectopic      Multiple      Live Births              Past Medical History:  Diagnosis Date  . Anxiety   . Endometriosis   . Migraines   . Ovarian cyst   . Pregnancy induced hypertension     Past Surgical History:  Procedure Laterality Date  . ABDOMINAL HYSTERECTOMY     2009  . CESAREAN SECTION     1999  . LAPAROSCOPY     x5  . LEFT OOPHORECTOMY    . TUBAL LIGATION      Family History  Problem Relation Age of Onset  . Anesthesia problems Neg Hx   . Hypotension Neg Hx   . Malignant hyperthermia Neg Hx   . Pseudochol deficiency Neg Hx     Social History   Tobacco Use  . Smoking status: Current Some Day Smoker    Packs/day: 0.50  . Smokeless tobacco: Never Used  Substance Use Topics  . Alcohol use: No  . Drug use: No    Allergies:  Allergies  Allergen Reactions  . Antihistamines, Diphenhydramine-Type Other (See Comments)    Heart palpitations & racing  . Penicillins Rash  . Sulfa Antibiotics Rash    Medications Prior to Admission  Medication Sig Dispense Refill Last Dose  . ALPRAZolam (XANAX) 1 MG tablet Take 1 mg by mouth 2 (two) times daily.   09/11/2016 at Unknown time  . clonazePAM (KLONOPIN) 1 MG tablet Take 1 tablet by mouth 2 (two) times daily.  3 09/11/2016  at Unknown time  . sertraline (ZOLOFT) 100 MG tablet Take 100 mg by mouth daily.  5 Not Taking at Unknown time   Results for orders placed or performed during the hospital encounter of 11/06/18 (from the past 48 hour(s))  Urinalysis, Routine w reflex microscopic     Status: None   Collection Time: 11/06/18 10:18 PM  Result Value Ref Range   Color, Urine YELLOW YELLOW   APPearance CLEAR CLEAR   Specific Gravity, Urine 1.016 1.005 - 1.030   pH 5.0 5.0 - 8.0   Glucose, UA NEGATIVE NEGATIVE mg/dL   Hgb urine dipstick NEGATIVE NEGATIVE   Bilirubin Urine NEGATIVE NEGATIVE   Ketones, ur NEGATIVE NEGATIVE mg/dL   Protein, ur NEGATIVE NEGATIVE mg/dL   Nitrite NEGATIVE NEGATIVE   Leukocytes, UA NEGATIVE NEGATIVE    Comment: Performed at [facility], [address] [place], [place] [address]    Review of Systems  Constitutional: Negative for fever.  Gastrointestinal:  Positive for nausea. Negative for vomiting.   Physical Exam   Blood pressure 129/78, pulse 81, temperature 98.1 F (36.7 C), resp. rate 18, height 4\' 11"  (1.499 m), weight 47.6 kg.  Physical Exam  Constitutional: She is oriented to person, place, and time. She appears well-developed and well-nourished.  Non-toxic appearance. She does not have a sickly appearance. She does not appear ill. No distress.  HENT:  Head: Normocephalic.  GI: Soft. Bowel sounds are normal. There is abdominal tenderness in the right lower quadrant and suprapubic area. There is no rigidity, no rebound and no guarding.  Genitourinary:    Genitourinary Comments: Patient declined STI testing  Bimanual exam: Adnexa non tender, no masses bilaterally  Chaperone present for exam.    Neurological: She is alert and oriented to person, place, and time.  Skin: Skin is warm. She is not diaphoretic.  Psychiatric: Her behavior is normal.    MAU Course  Procedures  MDM  Toradol given 60 mg IM Patient feeling much better and requesting to leave. Rates  her pain 0/10. No acute findings today. Patient requests small amount of narcotic pain medication.   Assessment and Plan   A:  1. Chronic abdominal pain     P:  Discharge home in stable condition Patient agreeable to ibuprofen use Encouraged GYN care: patient has a plan Return to MAU for emergencies only No narcotics were given

## 2018-11-07 NOTE — Discharge Instructions (Signed)
Abdominal Pain, Adult  Abdominal pain can be caused by many things. Often, abdominal pain is not serious and it gets better with no treatment or by being treated at home. However, sometimes abdominal pain is serious. Your health care provider will do a medical history and a physical exam to try to determine the cause of your abdominal pain.  Follow these instructions at home:   Take over-the-counter and prescription medicines only as told by your health care provider. Do not take a laxative unless told by your health care provider.   Drink enough fluid to keep your urine clear or pale yellow.   Watch your condition for any changes.   Keep all follow-up visits as told by your health care provider. This is important.  Contact a health care provider if:   Your abdominal pain changes or gets worse.   You are not hungry or you lose weight without trying.   You are constipated or have diarrhea for more than 2-3 days.   You have pain when you urinate or have a bowel movement.   Your abdominal pain wakes you up at night.   Your pain gets worse with meals, after eating, or with certain foods.   You are throwing up and cannot keep anything down.   You have a fever.  Get help right away if:   Your pain does not go away as soon as your health care provider told you to expect.   You cannot stop throwing up.   Your pain is only in areas of the abdomen, such as the right side or the left lower portion of the abdomen.   You have bloody or black stools, or stools that look like tar.   You have severe pain, cramping, or bloating in your abdomen.   You have signs of dehydration, such as:  ? Dark urine, very little urine, or no urine.  ? Cracked lips.  ? Dry mouth.  ? Sunken eyes.  ? Sleepiness.  ? Weakness.  This information is not intended to replace advice given to you by your health care provider. Make sure you discuss any questions you have with your health care provider.  Document Released: 08/13/2005 Document  Revised: 05/23/2016 Document Reviewed: 04/16/2016  Elsevier Interactive Patient Education  2019 [employer]vier Inc[employer]

## 2022-11-11 ENCOUNTER — Other Ambulatory Visit: Payer: Self-pay

## 2022-11-11 ENCOUNTER — Emergency Department
Admission: EM | Admit: 2022-11-11 | Discharge: 2022-11-11 | Disposition: A | Discharge status: Home / Self Care | Payer: Medicaid Other | Attending: Emergency Medicine | Admitting: Emergency Medicine

## 2022-11-11 DIAGNOSIS — B3731 Acute candidiasis of vulva and vagina: Secondary | ICD-10-CM | POA: Insufficient documentation

## 2022-11-11 DIAGNOSIS — L292 Pruritus vulvae: Secondary | ICD-10-CM | POA: Diagnosis present

## 2022-11-11 LAB — URINALYSIS, ROUTINE W REFLEX MICROSCOPIC
Bilirubin Urine: NEGATIVE
Glucose, UA: NEGATIVE mg/dL
Hgb urine dipstick: NEGATIVE
Ketones, ur: NEGATIVE mg/dL
Leukocytes,Ua: NEGATIVE
Nitrite: NEGATIVE
Protein, ur: NEGATIVE mg/dL
Specific Gravity, Urine: 1.001 — ABNORMAL LOW (ref 1.005–1.030)
pH: 6 (ref 5.0–8.0)

## 2022-11-11 LAB — WET PREP, GENITAL
Sperm: NONE SEEN
Trich, Wet Prep: NONE SEEN
WBC, Wet Prep HPF POC: <10 (ref <10)
Yeast Wet Prep HPF POC: NONE SEEN

## 2022-11-11 LAB — CHLAMYDIA/NGC RT PCR (ARMC ONLY)
Chlamydia Tr: NOT DETECTED
N gonorrhoeae: NOT DETECTED

## 2022-11-11 LAB — POC URINE PREG, ED: Preg Test, Ur: NEGATIVE

## 2022-11-11 MED ORDER — FLUCONAZOLE 100 MG PO TABS: 150.0000 mg | ORAL_TABLET | Freq: Every day | ORAL | 0 refills | Status: AC

## 2022-11-11 MED ORDER — FLUCONAZOLE 50 MG PO TABS
150.0000 mg | ORAL_TABLET | Freq: Once | ORAL | Status: AC
  Administered 2022-11-11: 150 mg via ORAL
  Filled 2022-11-11: qty 1

## 2022-11-11 NOTE — ED Triage Notes (Signed)
Pt comes with c/o UTI symptoms of burning, pain and itching. Pt states this all started late Saturday night. Pt denies any belly pain or vaginal bleeding.

## 2022-11-11 NOTE — ED Provider Triage Note (Signed)
  Emergency Medicine Provider Triage Evaluation Note  [name] , a 46 y.o.female,  was evaluated in triage.  Pt complains of    Review of Systems  Positive:  Negative: Denies fever, chest pain, vomiting  Physical Exam   Vitals:   11/11/22 0742  BP: 125/82  Pulse: 85  Resp: 17  Temp: 98.4 F (36.9 C)  SpO2: 97%   Gen:   Awake, no distress   Resp:  Normal effort  MSK:   Moves extremities without difficulty  Other:    Medical Decision Making  Given the patient's initial medical screening exam, the following diagnostic evaluation has been ordered. The patient will be placed in the appropriate treatment space, once one is available, to complete the evaluation and treatment. I have discussed the plan of care with the patient and I have advised the patient that an ED physician or mid-level practitioner will reevaluate their condition after the test results have been received, as the results may give them additional insight into the type of treatment they may need.    Diagnostics:   Treatments: none immediately   Varney Daily, [place] 11/11/22 1504

## 2022-11-11 NOTE — ED Provider Notes (Signed)
[facility] Provider Note    Event Date/Time   First MD Initiated Contact with Patient 11/11/22 1044     (approximate)   History   Vaginal Itching   HPI  [name] is a 46 y.o. female  presents with vaginal burning and itching.  Symptoms started about 3 days ago.  Patient endorses burning sensation and inflammation in the vaginal region.  Has had some white discharge as well.  Denies abdominal pain fevers chills vomiting.  She is sexually active with 1 partner is not concerned about STDs.  Has had 1 yeast infection at that the past and says that this feels very similar.  She tried topical treatments which have not helped.     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 --      Excl. in GC? --     Most recent vital signs: Vitals:   11/11/22 0742  BP: 125/82  Pulse: 85  Resp: 17  Temp: 98.4 F (36.9 C)  SpO2: 97%     General: Awake, no distress.  CV:  Good peripheral perfusion.  Resp:  Normal effort.  Abd:  No distention.  Abdomen is soft nontender Neuro:             Awake, Alert, Oriented x 3  Other:  External GU exam reveals inflammation of the vulva labia majora and minora no ulcers or other skin lesions   ED Results / Procedures / Treatments  Labs (all labs ordered  are listed, but only abnormal results are displayed) Labs Reviewed  URINALYSIS, ROUTINE W REFLEX MICROSCOPIC - Abnormal; Notable for the following components:      Result Value   Color, Urine COLORLESS (*)    APPearance CLEAR (*)    Specific Gravity, Urine 1.001 (*)    All other components within normal limits  WET PREP, GENITAL  CHLAMYDIA/NGC RT PCR ([facility] ONLY)            POC URINE PREG, ED     EKG     RADIOLOGY    PROCEDURES:  Critical Care performed: No  Procedures    MEDICATIONS ORDERED IN ED: Medications  fluconazole (DIFLUCAN) tablet 150 mg (has no administration in time range)     IMPRESSION / MDM / ASSESSMENT AND PLAN / ED COURSE  I reviewed the triage vital signs and the nursing notes.  Patient's presentation is most consistent with acute, uncomplicated illness.  Differential diagnosis includes, but is not limited to, Candida vulvovaginitis, contact dermatitis, STD, BV  Patient is a 46 year old female who presents with concern for yeast infection.  She has had some burning and itching in the vaginal region for about 3 days.  Has had some white discharge as well.  Has history of yeast infection in the past once before says this feels exactly the same.  Denies fevers belly pain.  On exam overall she looks well.  Abdominal exam is benign.  External GU exam does show some vulvar inflammation but no signs of HSV or other lesions.  Will treat empirically for Candida vaginitis.  Sent wet prep and GC chlamydia as well but patient is not concerned for STDs.  UA is clean and pregnancy test is negative.  Patient given dose of fluconazole in the ED and 1 additional dose for home if not improving.       FINAL CLINICAL IMPRESSION(S) / ED DIAGNOSES   Final diagnoses:  Candidal vulvovaginitis     Rx / DC Orders   ED Discharge Orders          Ordered    fluconazole (DIFLUCAN) 100 MG tablet  Daily        11/11/22 1059              Note:  This document was prepared using Dragon voice recognition software and may include unintentional dictation errors.   Georga Hacking, MD 11/11/22 [phone]

## 2022-11-11 NOTE — ED Notes (Signed)
Patient discharged to home per MD order. Patient in stable condition, and deemed medically cleared by ED provider for discharge. Discharge instructions reviewed with patient/family using "Teach Back"; verbalized understanding of medication education and administration, and information about follow-up care. Denies further concerns. ° °

## 2022-11-11 NOTE — Discharge Instructions (Signed)
If you still have symptoms in 3 days, you can take a second dose of fluconazole.

## 2024-03-07 ENCOUNTER — Other Ambulatory Visit: Payer: Self-pay

## 2024-03-07 ENCOUNTER — Emergency Department
Admission: EM | Admit: 2024-03-07 | Discharge: 2024-03-07 | Disposition: A | Discharge status: Home / Self Care | Attending: Emergency Medicine | Admitting: Emergency Medicine

## 2024-03-07 DIAGNOSIS — W57XXXA Bitten or stung by nonvenomous insect and other nonvenomous arthropods, initial encounter: Secondary | ICD-10-CM | POA: Diagnosis not present

## 2024-03-07 DIAGNOSIS — L03115 Cellulitis of right lower limb: Secondary | ICD-10-CM | POA: Insufficient documentation

## 2024-03-07 DIAGNOSIS — S80861A Insect bite (nonvenomous), right lower leg, initial encounter: Secondary | ICD-10-CM | POA: Insufficient documentation

## 2024-03-07 DIAGNOSIS — S70361A Insect bite (nonvenomous), right thigh, initial encounter: Secondary | ICD-10-CM | POA: Diagnosis not present

## 2024-03-07 MED ORDER — DOXYCYCLINE HYCLATE 100 MG PO TABS: 100.0000 mg | ORAL_TABLET | Freq: Two times a day (BID) | ORAL | 0 refills | Status: AC

## 2024-03-07 MED ORDER — DOXYCYCLINE HYCLATE 100 MG PO TABS
100.0000 mg | ORAL_TABLET | Freq: Once | ORAL | Status: AC
  Administered 2024-03-07: 100 mg via ORAL
  Filled 2024-03-07: qty 1

## 2024-03-07 NOTE — Discharge Instructions (Addendum)
 You have been diagnosed with insect bite, cellulitis.  Please take doxycycline  1 tablet by mouth every 12 hours for 7 days after main meals.  Please come back to ED or go to your PCP if you have new symptoms or symptoms worsen.

## 2024-03-07 NOTE — ED Triage Notes (Signed)
 Pt states she got bit by something Friday, pt has redness noted to right thigh. Pt states area of redness has been getting bigger and is itchy

## 2024-03-07 NOTE — ED Provider Notes (Signed)
 [facility] Provider Note    Event Date/Time   First MD Initiated Contact with Patient 03/07/24 2151     (approximate)   History   Insect Bite    HPI  [name] is a 48 y.o. female      with a past medical history of anxiety and depression, who presents to the ED complaining of right thigh redness after insect bite.    According to the patient, last Thursday she noticed redness on her right thigh, she was applying steroids cream, today the erythema increased.  Patient states some itchiness.  Patient denies fever, chills.  Patient usually walks outdoors.      Physical Exam   Triage Vital Signs: ED Triage Vitals  Encounter Vitals Group     BP 03/07/24 2032 132/84     Systolic BP Percentile --      Diastolic BP Percentile --      Pulse Rate 03/07/24 2032 82     Resp 03/07/24 2032 16     Temp 03/07/24 2032 97.7 F (36.5 C)     Temp Source 03/07/24 2032 Oral     SpO2 03/07/24 2032 100 %     Weight 03/07/24 2031 111 lb (50.3 kg)     Height 03/07/24 2031 4\' 11"  (1.499 m)     Head Circumference --      Peak Flow --      Pain Score 03/07/24 2031 0     Pain Loc --      Pain Education --      Exclude from Growth Chart --     Most recent vital signs: Vitals:   03/07/24 2032  BP: 132/84  Pulse: 82  Resp: 16  Temp: 97.7 F (36.5 C)  SpO2: 100%     Constitutional: Alert, NAD. Able to speak in complete sentences without cough or dyspnea  Eyes: Conjunctivae are normal.  Head: Atraumatic. Nose: No congestion/rhinnorhea. Mouth/Throat: Mucous membranes are moist.   Neck: Painless ROM. Supple. No JVD, nodes, thyromegaly  Cardiovascular:   Good peripheral circulation.RRR no murmurs, gallops, rubs  Respiratory: Normal respiratory effort.  No retractions. Clear to auscultation bilaterally without wheezing or crackles  Gastrointestinal: Soft and nontender.  Musculoskeletal:  no deformity Neurologic:  MAE spontaneously. No gross focal  neurologic deficits are appreciated.  Skin:  Skin is warm, dry and intact. No rash noted. Right tight: Anterior distal third, presence of round erythema, warmness, non tender.  No bull's-eye lesion.  Psychiatric: Mood and affect are normal. Speech and behavior are normal.    ED Results / Procedures / Treatments   Labs (all labs ordered are listed, but only abnormal results are displayed) Labs Reviewed - No data to display   EKG     RADIOLOGY      PROCEDURES:  Critical Care performed:   Procedures   MEDICATIONS ORDERED IN ED: Medications  doxycycline  (VIBRA -TABS) tablet 100 mg (has no administration in time range)      IMPRESSION / MDM / ASSESSMENT AND PLAN / ED COURSE  I reviewed the triage vital signs and the nursing notes.  Differential diagnosis includes, but is not limited to, abscess, cellulitis, Lyme disease  Patient's presentation is most consistent with acute, uncomplicated illness.   Patient's diagnosis is consistent with cellulitis secondary to insect bite. I did review the patient's allergies and medications.During admission patient received doxycycline . The patient is in stable and satisfactory condition for discharge home  Patient will be discharged home  with prescriptions for doxycycline . Patient is to follow up with PCP as needed or otherwise directed. Patient is given ED precautions to return to the ED for any worsening or new symptoms. Discussed plan of care with patient, answered all of patient's questions, Patient agreeable to plan of care. Advised patient to take medications according to the instructions on the label. Discussed possible side effects of new medications. Patient verbalized understanding.    FINAL CLINICAL IMPRESSION(S) / ED DIAGNOSES   Final diagnoses:  Insect bite (nonvenomous), right lower leg, initial encounter  Cellulitis of right leg     Rx / DC Orders   ED Discharge Orders          Ordered    doxycycline   (VIBRA -TABS) 100 MG tablet  2 times daily        03/07/24 2222             Note:  This document was prepared using Dragon voice recognition software and may include unintentional dictation errors.   Awilda Lennox, PA-C 03/07/24 2223    Twilla Galea, MD 03/07/24 2246

## 2024-07-26 DIAGNOSIS — L03115 Cellulitis of right lower limb: Secondary | ICD-10-CM | POA: Diagnosis not present

## 2024-07-26 DIAGNOSIS — S70361A Insect bite (nonvenomous), right thigh, initial encounter: Secondary | ICD-10-CM | POA: Diagnosis not present

## 2024-07-26 DIAGNOSIS — W57XXXA Bitten or stung by nonvenomous insect and other nonvenomous arthropods, initial encounter: Secondary | ICD-10-CM | POA: Diagnosis not present

## 2024-08-29 DIAGNOSIS — W57XXXA Bitten or stung by nonvenomous insect and other nonvenomous arthropods, initial encounter: Secondary | ICD-10-CM | POA: Diagnosis not present

## 2024-08-29 DIAGNOSIS — L03115 Cellulitis of right lower limb: Secondary | ICD-10-CM | POA: Diagnosis not present

## 2024-10-18 DIAGNOSIS — W57XXXA Bitten or stung by nonvenomous insect and other nonvenomous arthropods, initial encounter: Secondary | ICD-10-CM | POA: Diagnosis not present

## 2024-10-18 DIAGNOSIS — S40261A Insect bite (nonvenomous) of right shoulder, initial encounter: Secondary | ICD-10-CM | POA: Diagnosis not present
